# Patient Record
Sex: Female | Born: 1965 | Hispanic: No | Smoking: Current every day smoker
Health system: Southern US, Community
[De-identification: ages and names within clinical notes are randomized; demographics above are authoritative.]

## PROBLEM LIST (undated history)

## (undated) DIAGNOSIS — F4310 Post-traumatic stress disorder, unspecified: Secondary | ICD-10-CM

## (undated) DIAGNOSIS — J449 Chronic obstructive pulmonary disease, unspecified: Secondary | ICD-10-CM

## (undated) DIAGNOSIS — N289 Disorder of kidney and ureter, unspecified: Secondary | ICD-10-CM

## (undated) HISTORY — PX: OTHER SURGICAL HISTORY: SHX169

## (undated) HISTORY — DX: Disorder of kidney and ureter, unspecified: N28.9

## (undated) HISTORY — DX: Post-traumatic stress disorder, unspecified: F43.10

## (undated) HISTORY — PX: TUBAL LIGATION: SHX77

## (undated) HISTORY — PX: CHOLECYSTECTOMY: SHX55

## (undated) HISTORY — DX: Chronic obstructive pulmonary disease, unspecified: J44.9

## (undated) HISTORY — PX: MULTIPLE TOOTH EXTRACTIONS: SHX2053

---

## 1998-08-02 ENCOUNTER — Emergency Department (HOSPITAL_COMMUNITY): Admission: EM | Admit: 1998-08-02 | Discharge: 1998-08-02 | Payer: Self-pay | Admitting: Internal Medicine

## 1999-06-20 ENCOUNTER — Encounter: Admission: RE | Admit: 1999-06-20 | Discharge: 1999-06-20 | Payer: Self-pay | Admitting: Internal Medicine

## 1999-07-08 ENCOUNTER — Encounter: Admission: RE | Admit: 1999-07-08 | Discharge: 1999-07-08 | Payer: Self-pay | Admitting: Obstetrics & Gynecology

## 2002-02-22 ENCOUNTER — Other Ambulatory Visit: Admission: RE | Admit: 2002-02-22 | Discharge: 2002-02-22 | Payer: Self-pay | Admitting: Family Medicine

## 2005-11-24 ENCOUNTER — Emergency Department (HOSPITAL_COMMUNITY): Admission: EM | Admit: 2005-11-24 | Discharge: 2005-11-24 | Payer: Self-pay | Admitting: Emergency Medicine

## 2007-02-25 ENCOUNTER — Ambulatory Visit: Payer: Self-pay | Admitting: Nurse Practitioner

## 2007-02-28 ENCOUNTER — Ambulatory Visit (HOSPITAL_COMMUNITY): Admission: RE | Admit: 2007-02-28 | Discharge: 2007-02-28 | Payer: Self-pay | Admitting: Family Medicine

## 2007-03-10 ENCOUNTER — Ambulatory Visit: Payer: Self-pay | Admitting: Nurse Practitioner

## 2007-03-10 ENCOUNTER — Encounter (INDEPENDENT_AMBULATORY_CARE_PROVIDER_SITE_OTHER): Payer: Self-pay | Admitting: Nurse Practitioner

## 2007-04-07 ENCOUNTER — Ambulatory Visit: Payer: Self-pay | Admitting: Family Medicine

## 2008-01-11 IMAGING — US US TRANSVAGINAL NON-OB
1 series · 14 of 25 positions shown · non-contrast
Comparison: none

CLINICAL DATA: Patient has uterine bleeding.  History of left ovarian cyst. 
 TRANSABDOMINAL AND TRANSVAGINAL PELVIC ULTRASOUND:
TECHNIQUE: Both transabdominal and transvaginal ultrasound examinations of the pelvis were performed including evaluation of the uterus, ovaries, adnexal regions, and pelvic cul-de-sac.

[Series 1: unknown · 0.33mm/px · 14 of 109 slices shown]
[im 1/109]
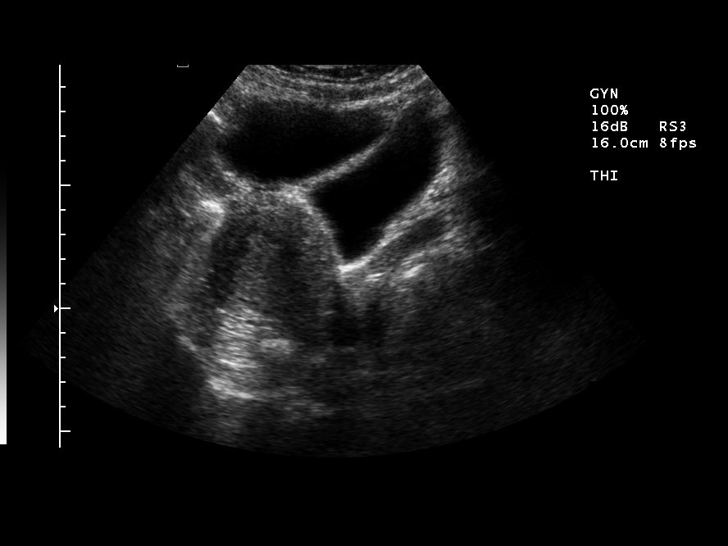
[im 10/109]
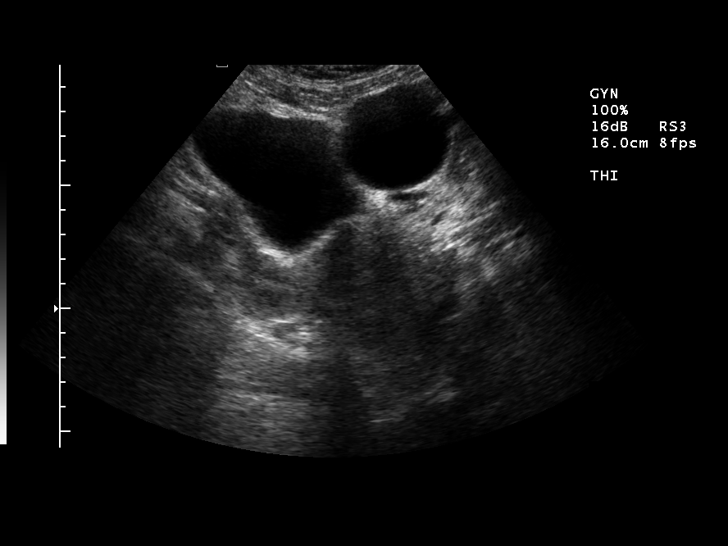
[im 19/109]
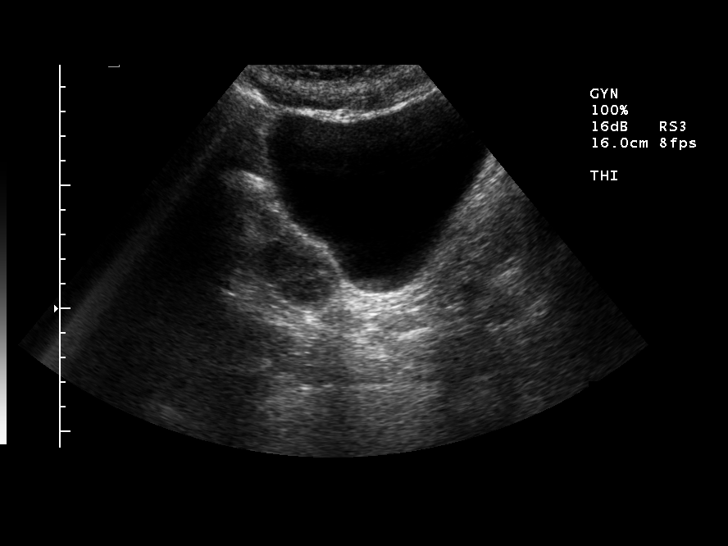
[im 28/109]
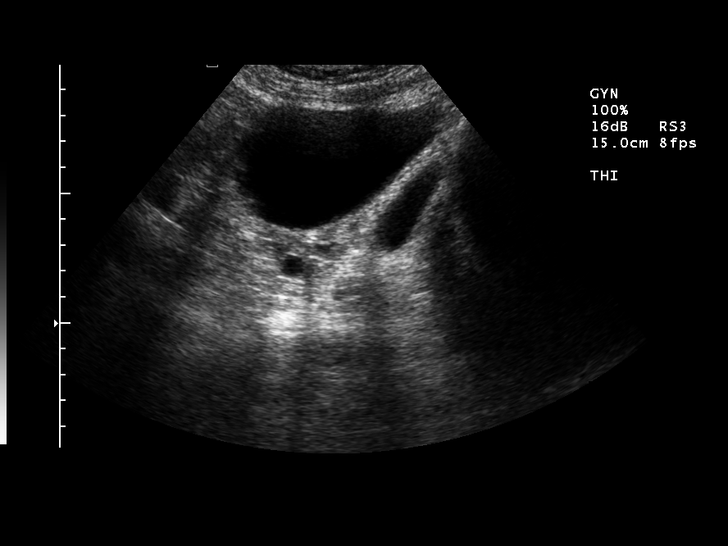
[im 37/109]
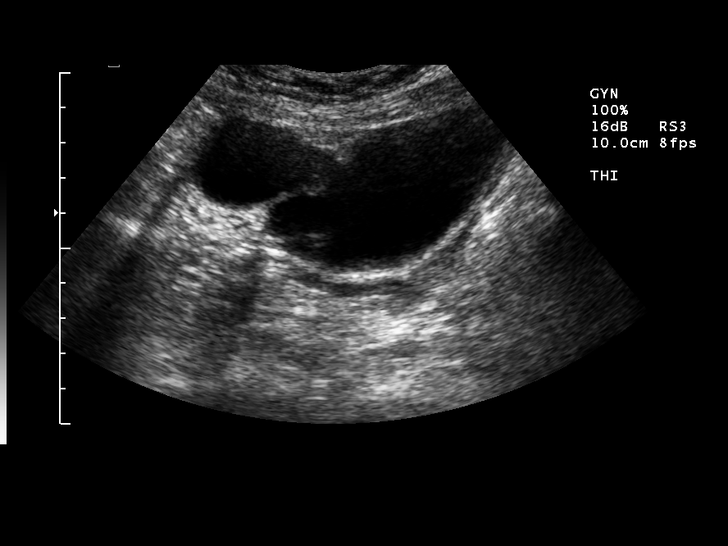
[im 41/109]
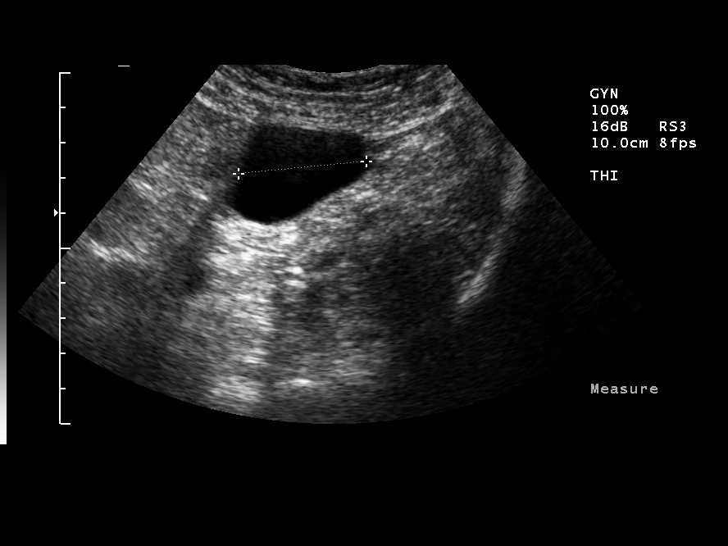
[im 50/109]
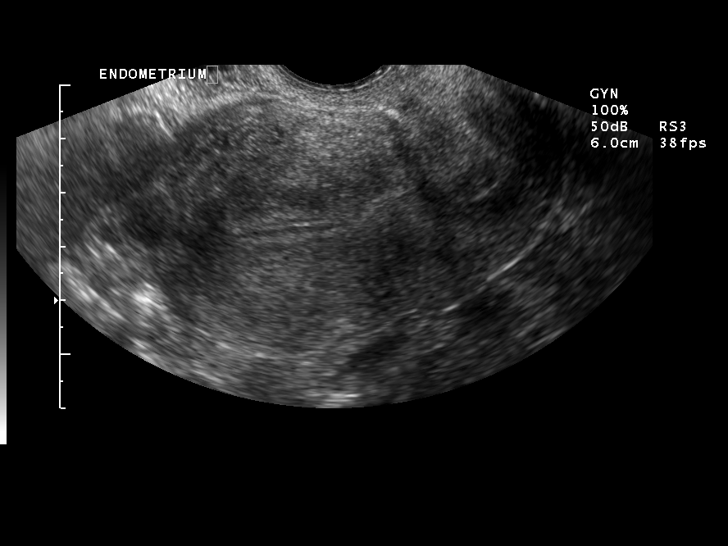
[im 59/109]
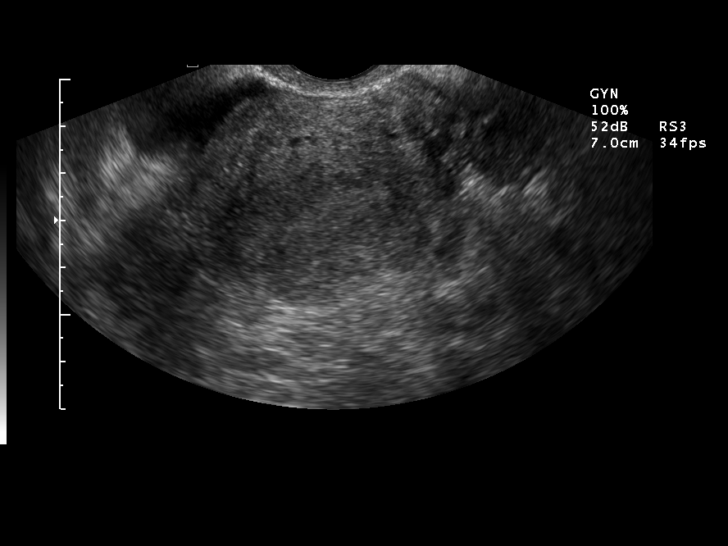
[im 68/109]
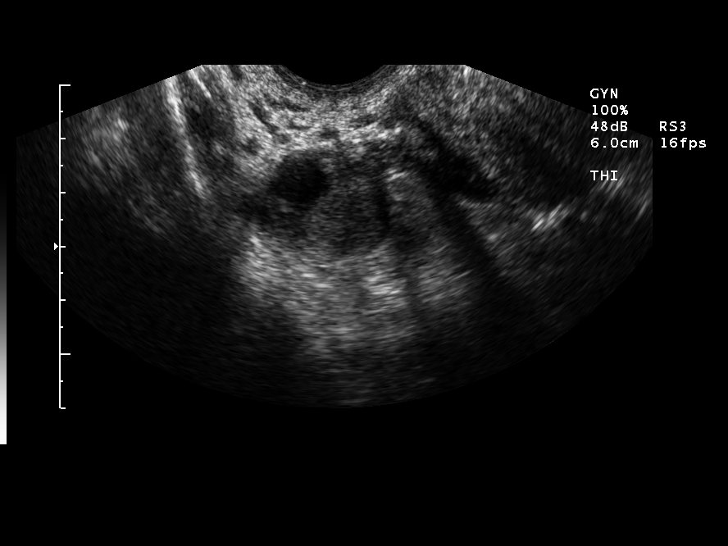
[im 73/109]
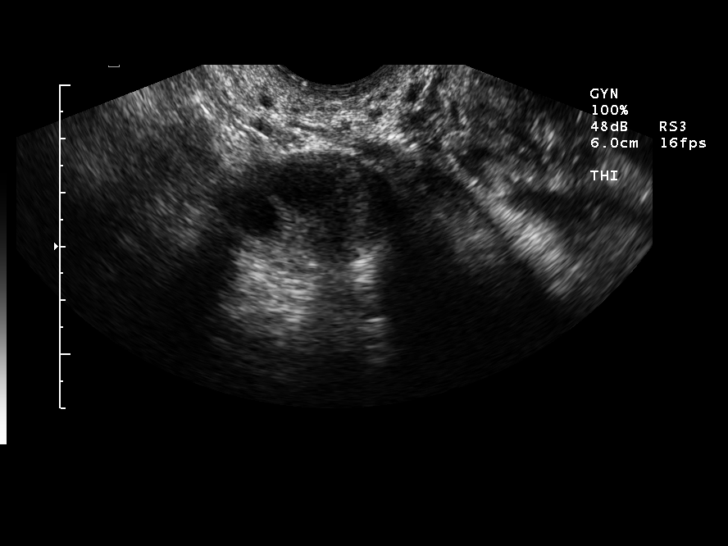
[im 82/109]
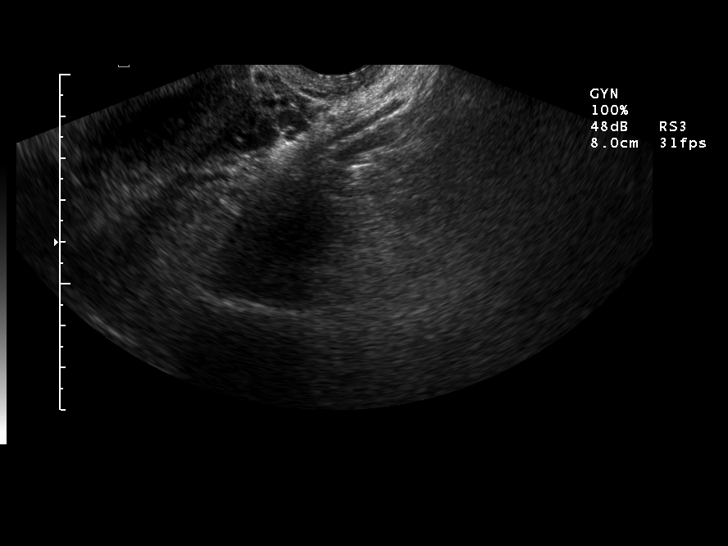
[im 91/109]
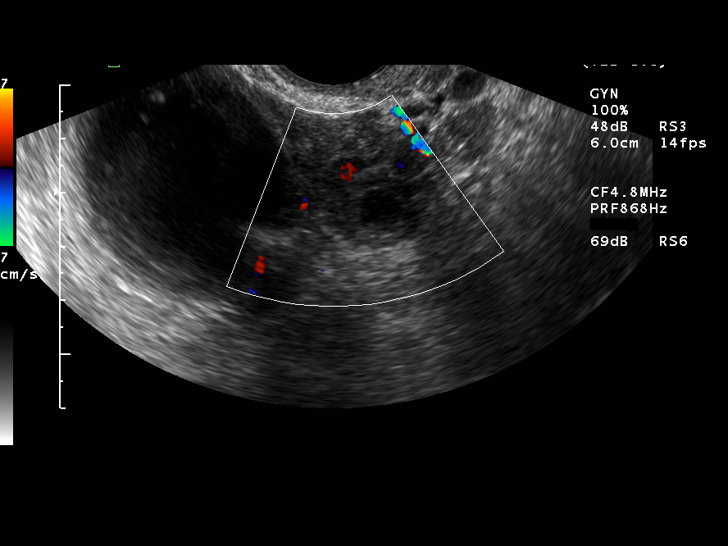
[im 100/109]
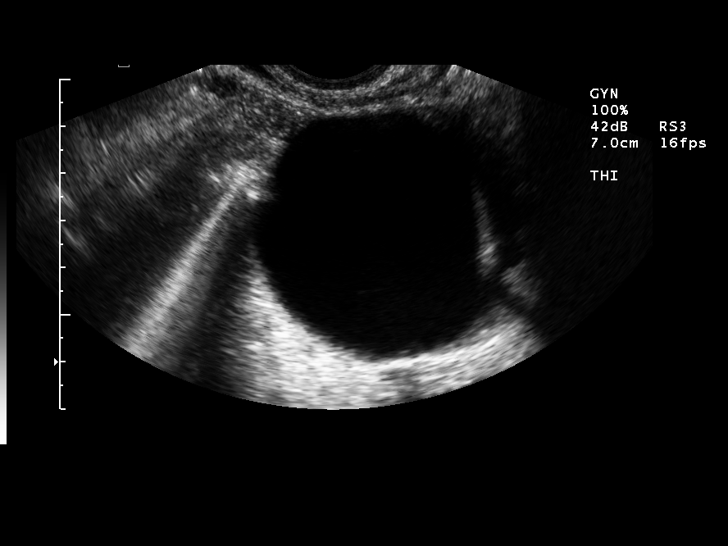
[im 109/109]
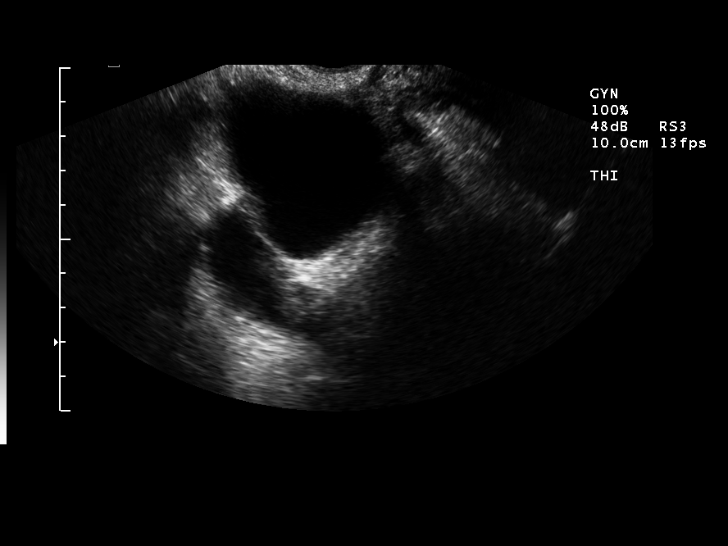

[14 of 25 positions shown; findings below may reference images not displayed]

FINDINGS: The uterus measures 8.3 x 4.6 x 5.5 cm.  Endometrial stripe is normal measuring up to 5.6 mm.  Right ovary measures 3.5 x 2.3 x 2.2 cm with a 1 cm right ovarian cyst.  The left ovary measures 2.7 x 1.9 x 1.6 cm with also a 1 cm cyst.  There is an 8.2 x 5.0 x 5.2 cm exophytic cystic area in the region of the left adnexa.  There is a small amount of free fluid.
IMPRESSION: 1.  8.2 cm left adnexal cyst.  
 2.  Followup ultrasound would be suggested in 2 to 3 months.

## 2010-11-16 ENCOUNTER — Encounter: Payer: Self-pay | Admitting: Nurse Practitioner

## 2021-04-25 DIAGNOSIS — Z419 Encounter for procedure for purposes other than remedying health state, unspecified: Secondary | ICD-10-CM | POA: Diagnosis not present

## 2021-05-26 DIAGNOSIS — Z419 Encounter for procedure for purposes other than remedying health state, unspecified: Secondary | ICD-10-CM | POA: Diagnosis not present

## 2021-06-26 DIAGNOSIS — Z419 Encounter for procedure for purposes other than remedying health state, unspecified: Secondary | ICD-10-CM | POA: Diagnosis not present

## 2021-07-17 ENCOUNTER — Encounter (HOSPITAL_COMMUNITY): Payer: Self-pay | Admitting: Emergency Medicine

## 2021-07-17 ENCOUNTER — Emergency Department (HOSPITAL_COMMUNITY)
Admission: EM | Admit: 2021-07-17 | Discharge: 2021-07-17 | Disposition: A | Discharge status: Home / Self Care | Payer: Medicaid Other | Attending: Emergency Medicine | Admitting: Emergency Medicine

## 2021-07-17 ENCOUNTER — Other Ambulatory Visit: Payer: Self-pay

## 2021-07-17 DIAGNOSIS — R000 Tachycardia, unspecified: Secondary | ICD-10-CM | POA: Insufficient documentation

## 2021-07-17 DIAGNOSIS — Z760 Encounter for issue of repeat prescription: Secondary | ICD-10-CM | POA: Insufficient documentation

## 2021-07-17 MED ORDER — CYCLOBENZAPRINE HCL 10 MG PO TABS: 10.0000 mg | ORAL_TABLET | Freq: Two times a day (BID) | ORAL | 0 refills | Status: DC | PRN

## 2021-07-17 NOTE — ED Provider Notes (Signed)
[facility] EMERGENCY DEPARTMENT Provider Note   CSN: [id number] Arrival date & time: 07/17/21  0630     History Chief Complaint  Patient presents with   Medication Refill    Tara Harper is a 55 y.o. female.  The history is provided by the patient. No language interpreter was used.  Medication Refill   55 year old female with hx of MS who presents requesting for medication refill.  She report she recently moved here, have not establish any primary care and have been without her medication for more than 1 month.  She is hoping to get her medication refill and to receive referral to PCP.  No SI/HI.  No new complaints  History reviewed. No pertinent past medical history.  There are no problems to display for this patient.   The histories are not reviewed yet. Please review them in the "History" navigator section and refresh this SmartLink.   OB History   No obstetric history on file.     No family history on file.     Home Medications Prior to Admission medications   Medication Sig Start Date End Date Taking? Authorizing Provider  cyclobenzaprine (FLEXERIL) 10 MG tablet Take 1 tablet (10 mg total) by mouth 2 (two) times daily as needed for muscle spasms. 07/17/21  Yes Fayrene Helper, PA-C    Allergies    Patient has no known allergies.  Review of Systems   Review of Systems  Constitutional:  Negative for fever.  Eyes:  Negative for visual disturbance.  Respiratory:  Negative for shortness of breath.   Cardiovascular:  Negative for chest pain.  Gastrointestinal:  Negative for abdominal pain.  Neurological:  Negative for headaches.  All other systems reviewed and are negative.  Physical Exam Updated Vital Signs BP (!) 144/90 (BP Location: Left Arm)   Pulse (!) 113   Temp 98.3 F (36.8 C) (Oral)   Resp 14   SpO2 99%   Physical Exam Vitals and nursing note reviewed.  Constitutional:      General: She is not in acute distress.     Appearance: She is well-developed.  HENT:     Head: Atraumatic.  Eyes:     Conjunctiva/sclera: Conjunctivae normal.  Cardiovascular:     Rate and Rhythm: Tachycardia present.  Pulmonary:     Effort: Pulmonary effort is normal.  Musculoskeletal:     Cervical back: Neck supple.  Skin:    Findings: No rash.  Neurological:     Mental Status: She is alert.  Psychiatric:        Mood and Affect: Mood normal.    ED Results / Procedures / Treatments   Labs (all labs ordered are listed, but only abnormal results are displayed) Labs Reviewed - No data to display  EKG None  Radiology No results found.  Procedures Procedures   Medications Ordered in ED Medications - No data to display  ED Course  I have reviewed the triage vital signs and the nursing notes.  Pertinent labs & imaging results that were available during my care of the patient were reviewed by me and considered in my medical decision making (see chart for details).    MDM Rules/Calculators/A&P                           BP (!) 144/90 (BP Location: Left Arm)   Pulse (!) 113   Temp 98.3 F (36.8 C) (Oral)   Resp  14   SpO2 99%   Final Clinical Impression(s) / ED Diagnoses Final diagnoses:  Medication refill    Rx / DC Orders ED Discharge Orders          Ordered    cyclobenzaprine (FLEXERIL) 10 MG tablet  2 times daily PRN        07/17/21 0931           Pt here requesting to have a PCP referral as well as refill of her MS medication that she has been out of for more than 1 month.  I have given her resources for PCP.  Will also prescribe flexeril per request.     Fayrene Helper, PA-C 07/17/21 1062    Linwood Dibbles, MD 07/20/21 [phone]

## 2021-07-17 NOTE — ED Notes (Signed)
Patient discharge paperwork reviewed with the patient. The patient verbalized understanding of instructions. Pt discharged.

## 2021-07-17 NOTE — Discharge Instructions (Signed)
Please call and contact [facility] for outpatient care.

## 2021-07-17 NOTE — ED Triage Notes (Signed)
Patient coming from home, complaint of being out of all medications for approx 1 month. Pt states she moved back from [place] and has not been able to setup PCP and has not been able to get medications refilled.

## 2021-07-23 ENCOUNTER — Ambulatory Visit: Payer: Self-pay | Admitting: Nurse Practitioner

## 2021-07-26 DIAGNOSIS — Z419 Encounter for procedure for purposes other than remedying health state, unspecified: Secondary | ICD-10-CM | POA: Diagnosis not present

## 2021-08-26 DIAGNOSIS — Z419 Encounter for procedure for purposes other than remedying health state, unspecified: Secondary | ICD-10-CM | POA: Diagnosis not present

## 2021-09-01 ENCOUNTER — Ambulatory Visit: Payer: Medicaid Other | Admitting: Family Medicine

## 2021-09-25 DIAGNOSIS — Z419 Encounter for procedure for purposes other than remedying health state, unspecified: Secondary | ICD-10-CM | POA: Diagnosis not present

## 2021-10-26 DIAGNOSIS — Z419 Encounter for procedure for purposes other than remedying health state, unspecified: Secondary | ICD-10-CM | POA: Diagnosis not present

## 2021-11-26 DIAGNOSIS — Z419 Encounter for procedure for purposes other than remedying health state, unspecified: Secondary | ICD-10-CM | POA: Diagnosis not present

## 2021-12-09 ENCOUNTER — Ambulatory Visit: Payer: Medicaid Other | Admitting: Adult Health

## 2021-12-18 ENCOUNTER — Other Ambulatory Visit: Payer: Self-pay

## 2021-12-18 ENCOUNTER — Ambulatory Visit (INDEPENDENT_AMBULATORY_CARE_PROVIDER_SITE_OTHER): Payer: Medicaid Other | Admitting: Family

## 2021-12-18 ENCOUNTER — Encounter: Payer: Self-pay | Admitting: Family

## 2021-12-18 VITALS — BP 124/86 | HR 88 | Ht 64.0 in | Wt 148.0 lb

## 2021-12-18 DIAGNOSIS — E559 Vitamin D deficiency, unspecified: Secondary | ICD-10-CM

## 2021-12-18 DIAGNOSIS — G35 Multiple sclerosis: Secondary | ICD-10-CM

## 2021-12-18 DIAGNOSIS — R10821 Right upper quadrant rebound abdominal tenderness: Secondary | ICD-10-CM

## 2021-12-18 DIAGNOSIS — F4312 Post-traumatic stress disorder, chronic: Secondary | ICD-10-CM | POA: Diagnosis not present

## 2021-12-18 DIAGNOSIS — K219 Gastro-esophageal reflux disease without esophagitis: Secondary | ICD-10-CM

## 2021-12-18 DIAGNOSIS — K069 Disorder of gingiva and edentulous alveolar ridge, unspecified: Secondary | ICD-10-CM | POA: Diagnosis not present

## 2021-12-18 DIAGNOSIS — J449 Chronic obstructive pulmonary disease, unspecified: Secondary | ICD-10-CM | POA: Diagnosis not present

## 2021-12-18 DIAGNOSIS — R17 Unspecified jaundice: Secondary | ICD-10-CM

## 2021-12-18 DIAGNOSIS — F331 Major depressive disorder, recurrent, moderate: Secondary | ICD-10-CM | POA: Diagnosis not present

## 2021-12-18 DIAGNOSIS — E782 Mixed hyperlipidemia: Secondary | ICD-10-CM

## 2021-12-18 DIAGNOSIS — F17200 Nicotine dependence, unspecified, uncomplicated: Secondary | ICD-10-CM

## 2021-12-18 DIAGNOSIS — Z9189 Other specified personal risk factors, not elsewhere classified: Secondary | ICD-10-CM

## 2021-12-18 DIAGNOSIS — R634 Abnormal weight loss: Secondary | ICD-10-CM

## 2021-12-18 DIAGNOSIS — E538 Deficiency of other specified B group vitamins: Secondary | ICD-10-CM

## 2021-12-18 DIAGNOSIS — R160 Hepatomegaly, not elsewhere classified: Secondary | ICD-10-CM

## 2021-12-18 DIAGNOSIS — M62838 Other muscle spasm: Secondary | ICD-10-CM | POA: Diagnosis not present

## 2021-12-18 DIAGNOSIS — H259 Unspecified age-related cataract: Secondary | ICD-10-CM | POA: Diagnosis not present

## 2021-12-18 DIAGNOSIS — F419 Anxiety disorder, unspecified: Secondary | ICD-10-CM

## 2021-12-18 DIAGNOSIS — R809 Proteinuria, unspecified: Secondary | ICD-10-CM

## 2021-12-18 DIAGNOSIS — N1832 Chronic kidney disease, stage 3b: Secondary | ICD-10-CM

## 2021-12-18 DIAGNOSIS — G43119 Migraine with aura, intractable, without status migrainosus: Secondary | ICD-10-CM

## 2021-12-18 MED ORDER — SIMVASTATIN 40 MG PO TABS: 40.0000 mg | ORAL_TABLET | Freq: Every day | ORAL | 3 refills | Status: DC

## 2021-12-18 MED ORDER — TIZANIDINE HCL 4 MG PO TABS: 4.0000 mg | ORAL_TABLET | Freq: Four times a day (QID) | ORAL | 2 refills | Status: DC | PRN

## 2021-12-18 MED ORDER — OMEPRAZOLE 20 MG PO CPDR: 20.0000 mg | DELAYED_RELEASE_CAPSULE | Freq: Every day | ORAL | 1 refills | Status: DC

## 2021-12-18 MED ORDER — ESCITALOPRAM OXALATE 10 MG PO TABS: 10.0000 mg | ORAL_TABLET | Freq: Every day | ORAL | 0 refills | Status: DC
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
## 2021-12-18 NOTE — Patient Instructions (Addendum)
A referral was placed today for neurology for your MS. Also referred to dental for dental health evaluation.  Please let us know if you have not heard back within 1 week about your referral.  I have ordered imaging for you at [facility] outpatient diagnostic center for ultrasound abdomen to include the liver. This order has been sent over for you electronically.  Please call [phone] to schedule this appointment.  I have created an order for lab work today during our visit.  Please schedule an appointment on your way out to return to the lab at your convenience. Please return fasting at your lab appointment (meaning you can only drink black coffee and or water prior to your appointment). I will reach out to you in regards to the labs when I receive the results.   Start lexapro 10 mg for anxiety and depression. Take 1/2 tablet by mouth once daily for about one week, then increase to 1 full tablet thereafter.   Taking the medicine as directed and not missing any doses is one of the best things you can do to treat your anxiety/depression.  Here are some things to keep in mind:  Side effects (stomach upset, some increased anxiety) may happen before you notice a benefit.  These side effects typically go away over time. Changes to your dose of medicine or a change in medication all together is sometimes necessary Many people will notice an improvement within two weeks but the full effect of the medication can take up to 4-6 weeks Stopping the medication when you start feeling better often results in a return of symptoms. Most people need to be on medication at least 6-12 months If you start having thoughts of hurting yourself or others after starting this medicine, please call me immediately.    It was a pleasure seeing you today! Please do not hesitate to reach out with any questions and or concerns.  Regards,   Mort Sawyers FNP-C

## 2021-12-19 ENCOUNTER — Other Ambulatory Visit (INDEPENDENT_AMBULATORY_CARE_PROVIDER_SITE_OTHER): Payer: Medicaid Other

## 2021-12-19 ENCOUNTER — Telehealth: Payer: Self-pay | Admitting: Family

## 2021-12-19 ENCOUNTER — Other Ambulatory Visit: Payer: Self-pay | Admitting: Family

## 2021-12-19 DIAGNOSIS — G35D Multiple sclerosis, unspecified: Secondary | ICD-10-CM

## 2021-12-19 DIAGNOSIS — R160 Hepatomegaly, not elsewhere classified: Secondary | ICD-10-CM

## 2021-12-19 DIAGNOSIS — R17 Unspecified jaundice: Secondary | ICD-10-CM

## 2021-12-19 DIAGNOSIS — G35 Multiple sclerosis: Secondary | ICD-10-CM | POA: Diagnosis not present

## 2021-12-19 DIAGNOSIS — R634 Abnormal weight loss: Secondary | ICD-10-CM

## 2021-12-19 DIAGNOSIS — N1832 Chronic kidney disease, stage 3b: Secondary | ICD-10-CM | POA: Diagnosis not present

## 2021-12-19 DIAGNOSIS — E782 Mixed hyperlipidemia: Secondary | ICD-10-CM

## 2021-12-19 DIAGNOSIS — E538 Deficiency of other specified B group vitamins: Secondary | ICD-10-CM

## 2021-12-19 DIAGNOSIS — R10821 Right upper quadrant rebound abdominal tenderness: Secondary | ICD-10-CM

## 2021-12-19 DIAGNOSIS — E559 Vitamin D deficiency, unspecified: Secondary | ICD-10-CM | POA: Diagnosis not present

## 2021-12-19 DIAGNOSIS — R1011 Right upper quadrant pain: Secondary | ICD-10-CM

## 2021-12-19 LAB — CBC WITH DIFFERENTIAL/PLATELET
Basophils Absolute: 0.1 10*3/uL (ref 0.0–0.1)
Basophils Relative: 0.7 % (ref 0.0–3.0)
Eosinophils Absolute: 0.1 10*3/uL (ref 0.0–0.7)
Eosinophils Relative: 1.8 % (ref 0.0–5.0)
HCT: 44.1 % (ref 36.0–46.0)
Hemoglobin: 14.3 g/dL (ref 12.0–15.0)
Lymphocytes Relative: 43.4 % (ref 12.0–46.0)
Lymphs Abs: 3.1 10*3/uL (ref 0.7–4.0)
MCHC: 32.4 g/dL (ref 30.0–36.0)
MCV: 92.7 fl (ref 78.0–100.0)
Monocytes Absolute: 0.4 10*3/uL (ref 0.1–1.0)
Monocytes Relative: 5.2 % (ref 3.0–12.0)
Neutro Abs: 3.5 10*3/uL (ref 1.4–7.7)
Neutrophils Relative %: 48.9 % (ref 43.0–77.0)
Platelets: 243 10*3/uL (ref 150.0–400.0)
RBC: 4.75 Mil/uL (ref 3.87–5.11)
RDW: 13.4 % (ref 11.5–15.5)
WBC: 7.1 10*3/uL (ref 4.0–10.5)

## 2021-12-19 LAB — LIPID PANEL
Cholesterol: 180 mg/dL (ref 0–200)
HDL: 38 mg/dL — ABNORMAL LOW (ref >39.00)
LDL Cholesterol: 108 mg/dL — ABNORMAL HIGH (ref 0–99)
NonHDL: 142.09
Total CHOL/HDL Ratio: 5
Triglycerides: 169 mg/dL — ABNORMAL HIGH (ref 0.0–149.0)
VLDL: 33.8 mg/dL (ref 0.0–40.0)

## 2021-12-19 LAB — B12 AND FOLATE PANEL
Folate: 5.8 ng/mL — ABNORMAL LOW (ref >5.9)
Vitamin B-12: 785 pg/mL (ref 211–911)

## 2021-12-19 LAB — COMPREHENSIVE METABOLIC PANEL
ALT: 8 U/L (ref 0–35)
AST: 13 U/L (ref 0–37)
Albumin: 3.9 g/dL (ref 3.5–5.2)
Alkaline Phosphatase: 80 U/L (ref 39–117)
BUN: 17 mg/dL (ref 6–23)
CO2: 28 mEq/L (ref 19–32)
Calcium: 9.2 mg/dL (ref 8.4–10.5)
Chloride: 108 mEq/L (ref 96–112)
Creatinine, Ser: 1.83 mg/dL — ABNORMAL HIGH (ref 0.40–1.20)
GFR: 30.63 mL/min — ABNORMAL LOW (ref >60.00)
Glucose, Bld: 93 mg/dL (ref 70–99)
Potassium: 4.7 mEq/L (ref 3.5–5.1)
Sodium: 140 mEq/L (ref 135–145)
Total Bilirubin: 0.4 mg/dL (ref 0.2–1.2)
Total Protein: 6.4 g/dL (ref 6.0–8.3)

## 2021-12-19 LAB — VITAMIN D 25 HYDROXY (VIT D DEFICIENCY, FRACTURES): VITD: 40.21 ng/mL (ref 30.00–100.00)

## 2021-12-19 LAB — T4, FREE: Free T4: 0.81 ng/dL (ref 0.60–1.60)

## 2021-12-19 LAB — TSH: TSH: 2.32 u[IU]/mL (ref 0.35–5.50)

## 2021-12-19 LAB — T3, FREE: T3, Free: 3.1 pg/mL (ref 2.3–4.2)

## 2021-12-19 NOTE — Telephone Encounter (Signed)
Pt called--questioning if needed to be worried about getting the ultrasound done, what being said after OV yesterday. Pt questioning if ok to go out of town tomorrow.

## 2021-12-19 NOTE — Telephone Encounter (Signed)
Pt would like tabitha dugal to give her a call today about concerns of her appointment with tabitha from 2.23.23

## 2021-12-19 NOTE — Telephone Encounter (Signed)
Pt stated --drunk coffee with cream but no food and ashtyn will call her back  for info.

## 2021-12-22 ENCOUNTER — Other Ambulatory Visit: Payer: Self-pay

## 2021-12-22 ENCOUNTER — Ambulatory Visit: Payer: Medicaid Other

## 2021-12-22 ENCOUNTER — Ambulatory Visit
Admission: RE | Admit: 2021-12-22 | Discharge: 2021-12-22 | Disposition: A | Discharge status: Home / Self Care | Payer: Medicaid Other | Source: Ambulatory Visit | Attending: Family | Admitting: Family

## 2021-12-22 DIAGNOSIS — R160 Hepatomegaly, not elsewhere classified: Secondary | ICD-10-CM | POA: Insufficient documentation

## 2021-12-22 DIAGNOSIS — R634 Abnormal weight loss: Secondary | ICD-10-CM | POA: Insufficient documentation

## 2021-12-22 DIAGNOSIS — E559 Vitamin D deficiency, unspecified: Secondary | ICD-10-CM | POA: Insufficient documentation

## 2021-12-22 DIAGNOSIS — R10821 Right upper quadrant rebound abdominal tenderness: Secondary | ICD-10-CM | POA: Insufficient documentation

## 2021-12-22 DIAGNOSIS — R1011 Right upper quadrant pain: Secondary | ICD-10-CM | POA: Insufficient documentation

## 2021-12-22 DIAGNOSIS — K7689 Other specified diseases of liver: Secondary | ICD-10-CM | POA: Diagnosis not present

## 2021-12-22 DIAGNOSIS — E538 Deficiency of other specified B group vitamins: Secondary | ICD-10-CM | POA: Insufficient documentation

## 2021-12-22 DIAGNOSIS — N281 Cyst of kidney, acquired: Secondary | ICD-10-CM | POA: Diagnosis not present

## 2021-12-22 DIAGNOSIS — R17 Unspecified jaundice: Secondary | ICD-10-CM | POA: Diagnosis not present

## 2021-12-22 DIAGNOSIS — R809 Proteinuria, unspecified: Secondary | ICD-10-CM | POA: Insufficient documentation

## 2021-12-22 LAB — HEPATITIS PANEL, ACUTE
Hep A IgM: NONREACTIVE
Hep B C IgM: NONREACTIVE
Hepatitis B Surface Ag: NONREACTIVE
Hepatitis C Ab: NONREACTIVE
SIGNAL TO CUT-OFF: <0.02 (ref <1.00)

## 2021-12-22 LAB — HIV ANTIBODY (ROUTINE TESTING W REFLEX): HIV 1&2 Ab, 4th Generation: NONREACTIVE

## 2021-12-22 NOTE — Assessment & Plan Note (Signed)
B12 ordered pending results °

## 2021-12-22 NOTE — Assessment & Plan Note (Signed)
Chronic was following nephrology in [place].  Referral today for consult for management.  Also assessing kidney function today

## 2021-12-22 NOTE — Assessment & Plan Note (Signed)
Hepatomegaly palpated on exam patient with yellowing skin ordering ultrasound stat pending results.  Also doing work-up for liver function

## 2021-12-22 NOTE — Assessment & Plan Note (Signed)
Continue omeprazole refill sent to the pharmacy. Try to decrease and or avoid spicy foods, fried fatty foods, and also caffeine and chocolate as these can increase heartburn symptoms.

## 2021-12-22 NOTE — Assessment & Plan Note (Signed)
Seemingly stable at current we will continue to monitor

## 2021-12-22 NOTE — Assessment & Plan Note (Addendum)
Patient with no desire to quit spoke with smoking cessation patient refuses We will discuss with patient more detail next visit and consider referral to lung screening clinic

## 2021-12-22 NOTE — Assessment & Plan Note (Signed)
Recommendations for both psychiatry as well as psychology for ongoing management.

## 2021-12-22 NOTE — Progress Notes (Signed)
Borderline cholesterol , work on low-cholesterol diet  Unable to compare kidney function to anything prior as patient new to our practice however she does states she was seeing a kidney specialist in the past for chronic kidney disease almost end-stage.  Referral for nephrologist in place please have her follow-up with them as her kidney function is on the lower end  B12 okay folate is low I recommend a daily over-the-counter supplement folic acid

## 2021-12-22 NOTE — Assessment & Plan Note (Signed)
Palpable liver on right upper quadrant exam today also with yellowing appearance of skin.  Abdominal ultrasound ordered pending results as well as work-up for lab work

## 2021-12-22 NOTE — Assessment & Plan Note (Addendum)
Pending  results of vitamin D ordered today

## 2021-12-22 NOTE — Progress Notes (Signed)
Liver not found to be concerning on ultrasound which is great news.  Pancreas that was visualized is good gallbladder is not in place due to surgery in the past.  There were noted to be right kidney cyst and increased echogenicity of the right kidney consistent with chronic kidney disease.  Follow-up with nephrologist as scheduled/as referred

## 2021-12-22 NOTE — Assessment & Plan Note (Signed)
Patient with frequent muscle spasms/muscle spasticity due to multiple sclerosis tizanidine usually is helpful.  Refilled with tizanidine patient also to follow-up with neurologist

## 2021-12-22 NOTE — Assessment & Plan Note (Signed)
Patient to continue with dentist as scheduled

## 2021-12-22 NOTE — Assessment & Plan Note (Signed)
Some lab work pending for a work-up of abnormal weight loss we will also check thyroid levels

## 2021-12-22 NOTE — Assessment & Plan Note (Signed)
Lipid panel ordered pending results patient work on low-cholesterol diet and exercise as tolerated, refill sent for simvastatin

## 2021-12-22 NOTE — Assessment & Plan Note (Addendum)
Work-up for liver evaluation to include ultrasound as well as lab work Extensive work-up to include several imaging as well as physical exam concerns as well as acute concerns and refilling medication speaking with patient to exceed 75 minutes.

## 2021-12-22 NOTE — Assessment & Plan Note (Signed)
Stable per current used to be on Elavil in the past with her neurologist.  Has been without medication for several months now.  Frequency is still stable however.  Patient to continue with ibuprofen as needed follow-up in 4 if frequency is to increase

## 2021-12-22 NOTE — Assessment & Plan Note (Signed)
Restart Lexapro  I instructed pt to start 1/2 tablet once daily for 1 week and then increase to a full tablet once daily on week two as tolerated.  We discussed common side effects such as nausea, drowsiness and weight gain.  Also discussed rare but serious side effect of suicidal ideation.  She is instructed to discontinue medication and go directly to ED if this occurs.  Pt verbalizes understanding.  Plan is to follow up in 30 days to evaluate progress.    Advised patient for now we will send to neurology to determine if they want to add this on Elavil can increase the toxicity or absorption of Lexapro.

## 2021-12-22 NOTE — Assessment & Plan Note (Signed)
Patient under care of dentist just recently saw today continue follow-up as necessary with dental

## 2021-12-22 NOTE — Assessment & Plan Note (Signed)
Patient to establish with ophthalmologist as she may need surgical resection of these cataracts

## 2021-12-22 NOTE — Assessment & Plan Note (Signed)
CMP ordered pending results also referral to nephrologist as patient was seeing in [place] and needs new nephrologist.

## 2021-12-22 NOTE — Assessment & Plan Note (Signed)
Patient no longer on medication and states lapsed since her last refill and has not recently seen a urologist.  Now patient with insurance and able to see neurologist again.  Ambulatory referral placed for evaluation and treat as well as consult

## 2021-12-24 DIAGNOSIS — Z419 Encounter for procedure for purposes other than remedying health state, unspecified: Secondary | ICD-10-CM | POA: Diagnosis not present

## 2022-01-02 ENCOUNTER — Encounter: Payer: Self-pay | Admitting: *Deleted

## 2022-01-14 ENCOUNTER — Other Ambulatory Visit: Payer: Self-pay | Admitting: Family

## 2022-01-14 DIAGNOSIS — F4312 Post-traumatic stress disorder, chronic: Secondary | ICD-10-CM

## 2022-01-15 ENCOUNTER — Other Ambulatory Visit: Payer: Self-pay

## 2022-01-15 ENCOUNTER — Telehealth: Payer: Self-pay

## 2022-01-15 ENCOUNTER — Encounter: Payer: Self-pay | Admitting: Family

## 2022-01-15 ENCOUNTER — Ambulatory Visit (INDEPENDENT_AMBULATORY_CARE_PROVIDER_SITE_OTHER): Payer: Medicaid Other | Admitting: Family

## 2022-01-15 VITALS — BP 146/90 | HR 94 | Ht 64.5 in | Wt 147.0 lb

## 2022-01-15 DIAGNOSIS — F4312 Post-traumatic stress disorder, chronic: Secondary | ICD-10-CM | POA: Diagnosis not present

## 2022-01-15 DIAGNOSIS — F331 Major depressive disorder, recurrent, moderate: Secondary | ICD-10-CM | POA: Diagnosis not present

## 2022-01-15 DIAGNOSIS — G35 Multiple sclerosis: Secondary | ICD-10-CM

## 2022-01-15 DIAGNOSIS — R7989 Other specified abnormal findings of blood chemistry: Secondary | ICD-10-CM

## 2022-01-15 DIAGNOSIS — D529 Folate deficiency anemia, unspecified: Secondary | ICD-10-CM

## 2022-01-15 DIAGNOSIS — I10 Essential (primary) hypertension: Secondary | ICD-10-CM

## 2022-01-15 DIAGNOSIS — R748 Abnormal levels of other serum enzymes: Secondary | ICD-10-CM

## 2022-01-15 DIAGNOSIS — R252 Cramp and spasm: Secondary | ICD-10-CM

## 2022-01-15 DIAGNOSIS — F419 Anxiety disorder, unspecified: Secondary | ICD-10-CM | POA: Diagnosis not present

## 2022-01-15 DIAGNOSIS — K219 Gastro-esophageal reflux disease without esophagitis: Secondary | ICD-10-CM | POA: Diagnosis not present

## 2022-01-15 LAB — MICROALBUMIN / CREATININE URINE RATIO
Creatinine,U: 116.3 mg/dL
Microalb Creat Ratio: 219.9 mg/g — ABNORMAL HIGH (ref 0.0–30.0)
Microalb, Ur: 255.7 mg/dL — ABNORMAL HIGH (ref 0.0–1.9)

## 2022-01-15 MED ORDER — OMEPRAZOLE 40 MG PO CPDR
40.0000 mg | DELAYED_RELEASE_CAPSULE | Freq: Every day | ORAL | 3 refills | Status: DC
Start: 2022-01-15 — End: 2022-07-08

## 2022-01-15 MED ORDER — LOSARTAN POTASSIUM 50 MG PO TABS: 50.0000 mg | ORAL_TABLET | Freq: Every day | ORAL | 1 refills | Status: DC

## 2022-01-15 MED ORDER — FOLIC ACID 1 MG PO TABS: 1.0000 mg | ORAL_TABLET | Freq: Every day | ORAL | 1 refills | Status: DC

## 2022-01-15 MED ORDER — ESCITALOPRAM OXALATE 20 MG PO TABS: 20.0000 mg | ORAL_TABLET | Freq: Every day | ORAL | 0 refills | Status: DC

## 2022-01-15 MED ORDER — TIZANIDINE HCL 4 MG PO TABS: ORAL_TABLET | ORAL | 2 refills | Status: DC

## 2022-01-15 MED ORDER — CYCLOBENZAPRINE HCL 5 MG PO TABS: ORAL_TABLET | ORAL | 2 refills | Status: DC

## 2022-01-15 NOTE — Progress Notes (Signed)
Urine microalbumin is  VERY high. Pt to call and make appt with kidney specialist, this along with decreased kidney function pt to be followed. Continue losartan as this protects the kidneys.
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
## 2022-01-15 NOTE — Patient Instructions (Addendum)
Your referral for your kidney doctor is with :  ?Maxie Barb, MD ?[phone] , you can call the number to schedule an appointment.  ? ?A referral was placed today for GI for bad reflux, possible need for Endoscopy and colonoscopy.  ?Please let us know if you have not heard back within 1 week about your referral. ? ?I have sent folic acid to your pharmacy to take daily as your folate is low.  ? ?I increased your omeprazole to 40 mg to see if this helps with symptoms.  ? ?I have also increased your lexapro to 20 mg that you will take once daily.  ? ?I want you to start taking daily losartan 50 mg once daily for your blood pressure.  ?Start monitoring your blood pressure daily, around the same time of day, for the next 2-3 weeks.  Ensure that you have rested for 30 minutes prior to checking your blood pressure. Record your readings and bring them to your next visit. ? ?It was a pleasure seeing you today! Please do not hesitate to reach out with any questions and or concerns. ? ?Regards,  ? ?Haroon Shatto ?FNP-C ? ? ? ? ? ? ?

## 2022-01-15 NOTE — Telephone Encounter (Signed)
CALLED PATIENT NO ANSWER LEFT VOICEMAIL FOR A CALL BACK ? ?

## 2022-01-17 LAB — URINE CULTURE
MICRO NUMBER:: 13170796
Result:: NO GROWTH
SPECIMEN QUALITY:: ADEQUATE

## 2022-01-18 DIAGNOSIS — R748 Abnormal levels of other serum enzymes: Secondary | ICD-10-CM | POA: Insufficient documentation

## 2022-01-18 DIAGNOSIS — R252 Cramp and spasm: Secondary | ICD-10-CM | POA: Insufficient documentation

## 2022-01-18 DIAGNOSIS — R7989 Other specified abnormal findings of blood chemistry: Secondary | ICD-10-CM | POA: Insufficient documentation

## 2022-01-18 DIAGNOSIS — I10 Essential (primary) hypertension: Secondary | ICD-10-CM | POA: Insufficient documentation

## 2022-01-18 DIAGNOSIS — D529 Folate deficiency anemia, unspecified: Secondary | ICD-10-CM | POA: Insufficient documentation

## 2022-01-18 NOTE — Assessment & Plan Note (Signed)
Refilled omeprazole 40 mg once daily  ?Try to decrease and or avoid spicy foods, fried fatty foods, and also caffeine and chocolate as these can increase heartburn symptoms.  ? ?

## 2022-01-18 NOTE — Assessment & Plan Note (Addendum)
Refilled losartan 50 mg  ?Ordered urine microalbumin ?Pt advised of the following:  ?Continue medication as prescribed. Monitor blood pressure periodically and/or when you feel symptomatic. Goal is <130/90 on average. Ensure that you have rested for 30 minutes prior to checking your blood pressure. Record your readings and bring them to your next visit if necessary.work on a low sodium diet. ? ?

## 2022-01-18 NOTE — Assessment & Plan Note (Signed)
Increased to lexapro 20 mg ?

## 2022-01-18 NOTE — Assessment & Plan Note (Signed)
rx sent for folic acid 1 mg to take daily ? ?

## 2022-01-18 NOTE — Assessment & Plan Note (Signed)
Continue, and refill given for tizanidine 4 mg , only take in QAM ?And new RX for flexeril 5 mg tablet to take QHS ?

## 2022-01-18 NOTE — Assessment & Plan Note (Signed)
Increase and RX sent for lexapro to 20 mg  ?Anxiety reducing handout given to pt.  ?Recommend pt establish with therapist ?

## 2022-01-18 NOTE — Assessment & Plan Note (Addendum)
Pt to call and make apt with neurology ?For muscle spasticity long d/w pt to take appropriately and will carefully monitor kidney function ?Continue, and refill given for tizanidine 4 mg , only take in QAM ?And new RX for flexeril 5 mg tablet to take QHS ? ?

## 2022-01-18 NOTE — Assessment & Plan Note (Signed)
Ordering urine culture to r/o uti  ? ?

## 2022-01-19 ENCOUNTER — Ambulatory Visit: Payer: Medicaid Other | Admitting: Gastroenterology

## 2022-01-24 DIAGNOSIS — Z419 Encounter for procedure for purposes other than remedying health state, unspecified: Secondary | ICD-10-CM | POA: Diagnosis not present

## 2022-01-26 ENCOUNTER — Other Ambulatory Visit: Payer: Self-pay | Admitting: Family

## 2022-01-26 DIAGNOSIS — F4312 Post-traumatic stress disorder, chronic: Secondary | ICD-10-CM

## 2022-01-26 DIAGNOSIS — F331 Major depressive disorder, recurrent, moderate: Secondary | ICD-10-CM

## 2022-01-27 DIAGNOSIS — Z905 Acquired absence of kidney: Secondary | ICD-10-CM | POA: Diagnosis not present

## 2022-01-27 DIAGNOSIS — I129 Hypertensive chronic kidney disease with stage 1 through stage 4 chronic kidney disease, or unspecified chronic kidney disease: Secondary | ICD-10-CM | POA: Diagnosis not present

## 2022-01-27 DIAGNOSIS — N1832 Chronic kidney disease, stage 3b: Secondary | ICD-10-CM | POA: Diagnosis not present

## 2022-01-27 DIAGNOSIS — R809 Proteinuria, unspecified: Secondary | ICD-10-CM | POA: Diagnosis not present

## 2022-01-28 ENCOUNTER — Encounter: Payer: Self-pay | Admitting: Neurology

## 2022-02-02 ENCOUNTER — Telehealth: Payer: Self-pay

## 2022-02-02 NOTE — Telephone Encounter (Signed)
Called pt and informed of the information.  ?

## 2022-02-18 DIAGNOSIS — N1832 Chronic kidney disease, stage 3b: Secondary | ICD-10-CM | POA: Diagnosis not present

## 2022-02-23 DIAGNOSIS — Z419 Encounter for procedure for purposes other than remedying health state, unspecified: Secondary | ICD-10-CM | POA: Diagnosis not present

## 2022-02-25 ENCOUNTER — Other Ambulatory Visit: Payer: Self-pay | Admitting: Family

## 2022-02-25 DIAGNOSIS — F4312 Post-traumatic stress disorder, chronic: Secondary | ICD-10-CM

## 2022-02-25 DIAGNOSIS — F331 Major depressive disorder, recurrent, moderate: Secondary | ICD-10-CM

## 2022-03-05 NOTE — Progress Notes (Deleted)
NEUROLOGY CONSULTATION NOTE  Tara Harper MRN: [id number] DOB: 1966/02/16  Referring provider: Mort Sawyers, FNP Primary care provider: Mort Sawyers, FNP  Reason for consult:  multiple sclerosis; migraine  Assessment/Plan:   ***   Subjective:  Tara Harper is a 56 year old ***-handed female with COPD, HTN, HLD, kidney disease and PTSD who presents to establish care for multiple sclerosis and migraines.  History supplemented by referring provider's notes.  Vision:  *** Motor:  *** Sensory:  *** Pain:  *** Gait:  *** Bowel/Bladder:  *** Fatigue:  *** Cognition:  *** Mood:  Depression and anxiety.  Migraines:  ***.  Was stable on amitriptyline.  Had been without it for several months but migraines remained stable.  Labs from February:  CBC with WBC 7.1, HGB 14.3, HCT 44.1, PLT 243, ALC 3.1; CMP with Na 140, K 4.7, Cl 108, CO2 28, glucose 93, BUN 17, Cr 1.83, t bili 0.4, ALP 80, AST 13, ALT 8, GFR 30.63; B12 785, folate 5.8; TSH 2.32; Hepatitis panel negative; SH 2.32; HIV negative; vit D 40.21  Current DMT:  None Current NSAIDS/analgesics:  *** Current triptans:  none Current ergotamine:  none Current anti-emetic:  none Current muscle relaxants:  cyclobenzaprine 5mg  QHS PRN, tizanidine 4mg  QAM PRN Current Antihypertensive medications:  losartan Current Antidepressant medications:  escitalopram 20mg  daily Current Anticonvulsant medications:  none Current anti-CGRP:  none Current Vitamins/Herbal/Supplements:  omega-3 Current Antihistamines/Decongestants:  none Other therapy:  *** Hormone/birth control:  none  Past DMT:  *** Past NSAIDS/analgesics:  *** Past abortive triptans:  *** Past abortive ergotamine:  *** Past muscle relaxants:  *** Past anti-emetic:  *** Past antihypertensive medications:  *** Past antidepressant medications:  amitriptyline (for migraines, helpful) Past anticonvulsant medications:  *** Past anti-CGRP:  *** Past  vitamins/Herbal/Supplements:  *** Past antihistamines/decongestants:  *** Other past therapies:  ***  Caffeine:  *** Alcohol:  *** Smoker:  *** Diet:  *** Exercise:  *** Depression:  ***; Anxiety:  *** Other pain:  *** Sleep hygiene:  *** Family history of headache:  ***      PAST MEDICAL HISTORY: Past Medical History:  Diagnosis Date   COPD (chronic obstructive pulmonary disease) ([facility])    Kidney disease    Post traumatic stress disorder     PAST SURGICAL HISTORY: Past Surgical History:  Procedure Laterality Date   CESAREAN SECTION     x2   CHOLECYSTECTOMY     MULTIPLE TOOTH EXTRACTIONS     gum disease   TUBAL LIGATION     umbilical hernia repaired      MEDICATIONS: Current Outpatient Medications on File Prior to Visit  Medication Sig Dispense Refill   aspirin EC 81 MG tablet Take 81 mg by mouth daily. Swallow whole.     cyclobenzaprine (FLEXERIL) 5 MG tablet Take one po qhs prn muscle spasm 30 tablet 2   escitalopram (LEXAPRO) 20 MG tablet TAKE 1 TABLET BY MOUTH EVERY DAY 90 tablet 0   folic acid (FOLVITE) 1 MG tablet Take 1 tablet (1 mg total) by mouth daily. 90 tablet 1   losartan (COZAAR) 50 MG tablet Take 1 tablet (50 mg total) by mouth daily. 90 tablet 1   Multiple Vitamins-Minerals (VITAMIN D3 COMPLETE PO) Take by mouth.     Omega-3 Fatty Acids (FISH OIL) 1000 MG CAPS Take by mouth.     omeprazole (PRILOSEC) 40 MG capsule Take 1 capsule (40 mg total) by mouth daily. 30 capsule 3   simvastatin (ZOCOR) 40  MG tablet Take 1 tablet (40 mg total) by mouth at bedtime. 90 tablet 3   tiZANidine (ZANAFLEX) 4 MG tablet Take one po qam prn muscle spasticity with MS Do not take with flexeril at same time 30 tablet 2   No current facility-administered medications on file prior to visit.    ALLERGIES: No Known Allergies  FAMILY HISTORY: Family History  Problem Relation Age of Onset   Lung cancer Mother    Heart disease Father    Diabetes Father     Objective:   *** General: No acute distress.  Patient appears well-groomed.   Head:  Normocephalic/atraumatic Eyes:  fundi examined but not visualized Neck: supple, no paraspinal tenderness, full range of motion Back: No paraspinal tenderness Heart: regular rate and rhythm Lungs: Clear to auscultation bilaterally. Vascular: No carotid bruits. Neurological Exam: Mental status: alert and oriented to person, place, and time, recent and remote memory intact, fund of knowledge intact, attention and concentration intact, speech fluent and not dysarthric, language intact. Cranial nerves: CN I: not tested CN II: pupils equal, round and reactive to light, visual fields intact CN III, IV, VI:  full range of motion, no nystagmus, no ptosis CN V: facial sensation intact. CN VII: upper and lower face symmetric CN VIII: hearing intact CN IX, X: gag intact, uvula midline CN XI: sternocleidomastoid and trapezius muscles intact CN XII: tongue midline Bulk & Tone: normal, no fasciculations. Motor:  muscle strength 5/5 throughout Sensation:  Pinprick, temperature and vibratory sensation intact. Deep Tendon Reflexes:  2+ throughout,  toes downgoing.   Finger to nose testing:  Without dysmetria.   Heel to shin:  Without dysmetria.   Gait:  Normal station and stride.  Romberg negative.    Thank you for allowing me to take part in the care of this patient.  Shon Millet, DO  CC: ***

## 2022-03-06 ENCOUNTER — Encounter: Payer: Self-pay | Admitting: Neurology

## 2022-03-06 ENCOUNTER — Ambulatory Visit: Payer: Medicaid Other | Admitting: Neurology

## 2022-03-06 DIAGNOSIS — Z029 Encounter for administrative examinations, unspecified: Secondary | ICD-10-CM

## 2022-03-16 ENCOUNTER — Ambulatory Visit: Payer: Medicaid Other | Admitting: Gastroenterology

## 2022-03-17 ENCOUNTER — Ambulatory Visit (INDEPENDENT_AMBULATORY_CARE_PROVIDER_SITE_OTHER): Payer: Medicaid Other | Admitting: Family

## 2022-03-17 ENCOUNTER — Other Ambulatory Visit: Payer: Self-pay | Admitting: Family

## 2022-03-17 ENCOUNTER — Encounter: Payer: Self-pay | Admitting: Family

## 2022-03-17 VITALS — BP 122/90 | HR 91 | Temp 98.3°F | Resp 16 | Ht 64.5 in | Wt 145.0 lb

## 2022-03-17 DIAGNOSIS — M62838 Other muscle spasm: Secondary | ICD-10-CM

## 2022-03-17 DIAGNOSIS — N1832 Chronic kidney disease, stage 3b: Secondary | ICD-10-CM | POA: Diagnosis not present

## 2022-03-17 DIAGNOSIS — G35 Multiple sclerosis: Secondary | ICD-10-CM | POA: Diagnosis not present

## 2022-03-17 DIAGNOSIS — R252 Cramp and spasm: Secondary | ICD-10-CM

## 2022-03-17 DIAGNOSIS — D529 Folate deficiency anemia, unspecified: Secondary | ICD-10-CM

## 2022-03-17 MED ORDER — TIZANIDINE HCL 2 MG PO CAPS: 2.0000 mg | ORAL_CAPSULE | Freq: Two times a day (BID) | ORAL | 5 refills | Status: DC
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
## 2022-03-20 NOTE — Telephone Encounter (Signed)
Pt called and wanted the nurse to know that her refill that was put in on the 23rd for: tizanidine (ZANAFLEX) 2 MG capsule, was in capsule form, she stated "the pharmacy said to rewrite it in tablet form instead." She tried to pick it up but it was not ready, she also stated that 2 mg was not enough and she wants to go to 4 mg instead. Please return a call when possible.  Callback Number: [phone]

## 2022-03-24 NOTE — Assessment & Plan Note (Signed)
Advised pt to call back again and schedule appt with neurology as this is important appt as pt possibly needs to get back on MS agents she was on previously for symptom control.

## 2022-03-24 NOTE — Assessment & Plan Note (Addendum)
continue folic acid 1 mg once daily Pt declined to get lab work today

## 2022-03-24 NOTE — Assessment & Plan Note (Signed)
rx tizanidine 2 mg, can take twice daily for muscle spasicity.  Renal precautions with CrCl.

## 2022-03-24 NOTE — Assessment & Plan Note (Signed)
Pt to continue f/u with nephrology as scheduled.

## 2022-03-26 DIAGNOSIS — Z419 Encounter for procedure for purposes other than remedying health state, unspecified: Secondary | ICD-10-CM | POA: Diagnosis not present

## 2022-04-25 DIAGNOSIS — Z419 Encounter for procedure for purposes other than remedying health state, unspecified: Secondary | ICD-10-CM | POA: Diagnosis not present

## 2022-05-08 DIAGNOSIS — N1832 Chronic kidney disease, stage 3b: Secondary | ICD-10-CM | POA: Diagnosis not present

## 2022-05-26 DIAGNOSIS — Z419 Encounter for procedure for purposes other than remedying health state, unspecified: Secondary | ICD-10-CM | POA: Diagnosis not present

## 2022-05-27 ENCOUNTER — Other Ambulatory Visit: Payer: Self-pay | Admitting: Family

## 2022-05-27 DIAGNOSIS — I10 Essential (primary) hypertension: Secondary | ICD-10-CM

## 2022-05-27 DIAGNOSIS — F4312 Post-traumatic stress disorder, chronic: Secondary | ICD-10-CM

## 2022-05-27 DIAGNOSIS — F331 Major depressive disorder, recurrent, moderate: Secondary | ICD-10-CM

## 2022-06-15 ENCOUNTER — Ambulatory Visit: Payer: Medicaid Other | Admitting: Neurology

## 2022-06-19 DIAGNOSIS — I129 Hypertensive chronic kidney disease with stage 1 through stage 4 chronic kidney disease, or unspecified chronic kidney disease: Secondary | ICD-10-CM | POA: Diagnosis not present

## 2022-06-19 DIAGNOSIS — R809 Proteinuria, unspecified: Secondary | ICD-10-CM | POA: Diagnosis not present

## 2022-06-19 DIAGNOSIS — N184 Chronic kidney disease, stage 4 (severe): Secondary | ICD-10-CM | POA: Diagnosis not present

## 2022-06-19 DIAGNOSIS — Z905 Acquired absence of kidney: Secondary | ICD-10-CM | POA: Diagnosis not present

## 2022-06-26 DIAGNOSIS — Z419 Encounter for procedure for purposes other than remedying health state, unspecified: Secondary | ICD-10-CM | POA: Diagnosis not present

## 2022-07-08 ENCOUNTER — Other Ambulatory Visit: Payer: Self-pay | Admitting: Family

## 2022-07-08 DIAGNOSIS — K219 Gastro-esophageal reflux disease without esophagitis: Secondary | ICD-10-CM

## 2022-07-08 DIAGNOSIS — D529 Folate deficiency anemia, unspecified: Secondary | ICD-10-CM

## 2022-07-14 ENCOUNTER — Other Ambulatory Visit: Payer: Self-pay | Admitting: Family

## 2022-07-14 DIAGNOSIS — F4312 Post-traumatic stress disorder, chronic: Secondary | ICD-10-CM
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
## 2022-07-22 ENCOUNTER — Other Ambulatory Visit (INDEPENDENT_AMBULATORY_CARE_PROVIDER_SITE_OTHER): Payer: Medicaid Other

## 2022-07-22 ENCOUNTER — Ambulatory Visit: Payer: Medicaid Other | Admitting: Neurology

## 2022-07-22 ENCOUNTER — Telehealth: Payer: Self-pay | Admitting: Neurology

## 2022-07-22 ENCOUNTER — Encounter: Payer: Self-pay | Admitting: Neurology

## 2022-07-22 VITALS — BP 114/77 | HR 92 | Ht 66.0 in | Wt 141.8 lb

## 2022-07-22 DIAGNOSIS — G43119 Migraine with aura, intractable, without status migrainosus: Secondary | ICD-10-CM | POA: Diagnosis not present

## 2022-07-22 DIAGNOSIS — G35 Multiple sclerosis: Secondary | ICD-10-CM | POA: Diagnosis not present

## 2022-07-22 LAB — CBC WITH DIFFERENTIAL/PLATELET
Basophils Absolute: 0.1 10*3/uL (ref 0.0–0.1)
Basophils Relative: 0.7 % (ref 0.0–3.0)
Eosinophils Absolute: 0.1 10*3/uL (ref 0.0–0.7)
Eosinophils Relative: 1.5 % (ref 0.0–5.0)
HCT: 41.9 % (ref 36.0–46.0)
Hemoglobin: 13.5 g/dL (ref 12.0–15.0)
Lymphocytes Relative: 28.6 % (ref 12.0–46.0)
Lymphs Abs: 2.2 10*3/uL (ref 0.7–4.0)
MCHC: 32.2 g/dL (ref 30.0–36.0)
MCV: 95.6 fl (ref 78.0–100.0)
Monocytes Absolute: 0.5 10*3/uL (ref 0.1–1.0)
Monocytes Relative: 7.1 % (ref 3.0–12.0)
Neutro Abs: 4.7 10*3/uL (ref 1.4–7.7)
Neutrophils Relative %: 62.1 % (ref 43.0–77.0)
Platelets: 239 10*3/uL (ref 150.0–400.0)
RBC: 4.38 Mil/uL (ref 3.87–5.11)
RDW: 13.2 % (ref 11.5–15.5)
WBC: 7.6 10*3/uL (ref 4.0–10.5)

## 2022-07-22 LAB — HEPATIC FUNCTION PANEL
ALT: 9 U/L (ref 0–35)
AST: 13 U/L (ref 0–37)
Albumin: 4.1 g/dL (ref 3.5–5.2)
Alkaline Phosphatase: 65 U/L (ref 39–117)
Bilirubin, Direct: 0.1 mg/dL (ref 0.0–0.3)
Total Bilirubin: 0.3 mg/dL (ref 0.2–1.2)
Total Protein: 7.1 g/dL (ref 6.0–8.3)

## 2022-07-22 LAB — VITAMIN D 25 HYDROXY (VIT D DEFICIENCY, FRACTURES): VITD: >120 ng/mL

## 2022-07-22 NOTE — Telephone Encounter (Signed)
Called [place] back and she was in the restroom. Was told to call back in about 5 minutes.

## 2022-07-22 NOTE — Telephone Encounter (Signed)
Called [facility] and unable to leave message. No voicemail and no answer.

## 2022-07-22 NOTE — Telephone Encounter (Signed)
Tara Harper is calling from the lab with critical results.

## 2022-07-22 NOTE — Patient Instructions (Addendum)
Will check MRI of brain/cervical/thoracic spine without contrast Check vit D, CBC with diff and hepatic panel Follow up after MRIs completed.

## 2022-07-23 NOTE — Progress Notes (Signed)
Patient advised.

## 2022-07-23 NOTE — Progress Notes (Signed)
LMOVm for patient to call the office back.  

## 2022-07-26 DIAGNOSIS — Z419 Encounter for procedure for purposes other than remedying health state, unspecified: Secondary | ICD-10-CM | POA: Diagnosis not present

## 2022-08-12 ENCOUNTER — Other Ambulatory Visit: Payer: Medicaid Other

## 2022-08-17 ENCOUNTER — Other Ambulatory Visit: Payer: Self-pay | Admitting: Family

## 2022-08-17 DIAGNOSIS — F4312 Post-traumatic stress disorder, chronic: Secondary | ICD-10-CM

## 2022-08-21 ENCOUNTER — Telehealth: Payer: Self-pay | Admitting: Neurology

## 2022-08-21 NOTE — Telephone Encounter (Signed)
Casey from [facility] called in about the prior authorization for the MRIs. They need additional notes or schedule a Peer to Peer. The Peer to Peer number is [phone]. Tracking number is [id number].   She stated the brain MRI was approved. Auth number: [id number]- 08/11/22-10/10/22.

## 2022-08-26 DIAGNOSIS — Z419 Encounter for procedure for purposes other than remedying health state, unspecified: Secondary | ICD-10-CM | POA: Diagnosis not present

## 2022-08-31 ENCOUNTER — Telehealth: Payer: Self-pay | Admitting: Anesthesiology

## 2022-08-31 NOTE — Telephone Encounter (Signed)
Patient called to follow up about this. She left a message to call back please.

## 2022-08-31 NOTE — Telephone Encounter (Signed)
See other phone note

## 2022-08-31 NOTE — Telephone Encounter (Signed)
Pt was called an given the number to [facility] so that she can get her MRI's scheduled she will call back to get a follow up schedule with Dr Tomi Likens after getting the MRI's scheduled.,

## 2022-08-31 NOTE — Telephone Encounter (Signed)
Patient called to ask about her MRI appointment now that it had been approved by her insurance. She has not received a call to get it scheduled yet.

## 2022-09-08 ENCOUNTER — Ambulatory Visit (INDEPENDENT_AMBULATORY_CARE_PROVIDER_SITE_OTHER): Payer: Medicaid Other | Admitting: Family

## 2022-09-08 ENCOUNTER — Ambulatory Visit (INDEPENDENT_AMBULATORY_CARE_PROVIDER_SITE_OTHER)
Admission: RE | Admit: 2022-09-08 | Discharge: 2022-09-08 | Disposition: A | Discharge status: Home / Self Care | Payer: Medicaid Other | Source: Ambulatory Visit | Attending: Family | Admitting: Family

## 2022-09-08 ENCOUNTER — Encounter: Payer: Self-pay | Admitting: Family

## 2022-09-08 VITALS — BP 110/62 | HR 77 | Temp 98.0°F | Resp 16 | Ht 66.0 in | Wt 141.1 lb

## 2022-09-08 DIAGNOSIS — M62838 Other muscle spasm: Secondary | ICD-10-CM

## 2022-09-08 DIAGNOSIS — S199XXA Unspecified injury of neck, initial encounter: Secondary | ICD-10-CM | POA: Diagnosis not present

## 2022-09-08 DIAGNOSIS — S299XXA Unspecified injury of thorax, initial encounter: Secondary | ICD-10-CM

## 2022-09-08 DIAGNOSIS — R809 Proteinuria, unspecified: Secondary | ICD-10-CM

## 2022-09-08 DIAGNOSIS — M47812 Spondylosis without myelopathy or radiculopathy, cervical region: Secondary | ICD-10-CM | POA: Diagnosis not present

## 2022-09-08 DIAGNOSIS — S0990XA Unspecified injury of head, initial encounter: Secondary | ICD-10-CM
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
## 2022-09-08 NOTE — Patient Instructions (Addendum)
  Lidocaine patches 4%  Tylenol as needed.    Regards,   Mort Sawyers FNP-C

## 2022-09-08 NOTE — Assessment & Plan Note (Signed)
Pt without muscle relaxer's, states was on them before moving here. States she went to neurologist who stated pcp needed to refill her muscle realxers, however flexeril/tizanidine were for muscle spasticity she noted from her Ms. Will correlate with neurology to see if these are still necessary.

## 2022-09-08 NOTE — Assessment & Plan Note (Signed)
For pain, recommend otc lidocaine patch 4% and tylenol only.  Advised against NSAIDS for kidney function

## 2022-09-08 NOTE — Assessment & Plan Note (Signed)
Stat xray thoracic spine ordered pending results.
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
## 2022-09-08 NOTE — Assessment & Plan Note (Signed)
Continue losartan. 

## 2022-09-08 NOTE — Assessment & Plan Note (Signed)
Stat ct head w/o contrast, pending insurance.  Did advise if any change in mental status, reoccurrence or worsening blurry vision or brain fog go to er and or call 911
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
## 2022-09-08 NOTE — Assessment & Plan Note (Signed)
Ordered cervical spine xray stat pending results

## 2022-09-10 ENCOUNTER — Telehealth: Payer: Self-pay

## 2022-09-10 NOTE — Telephone Encounter (Signed)
Left message to return call to our office.  

## 2022-09-21 NOTE — Progress Notes (Unsigned)
Cervical spine and thoracic Denied, Brain Approved.  Tried Engineer, structural to do a PEER-PEER, Per Dr. Mathews Robinsons Patient will Tara Harper to do the MRI Brain first to get a Baseline, Notes will have to say that the provider plan to start DMT.  Taron at [employer]: ok, I'll call and advise the patient of cancellation for the other two scans  8 mins TM Just spoke w/ patient and she aware she has to have the MRI Brain done so the results can be sent to insurance to see if they will approve the other 2.

## 2022-09-23 ENCOUNTER — Other Ambulatory Visit: Payer: Medicaid Other

## 2022-09-23 ENCOUNTER — Ambulatory Visit
Admission: RE | Admit: 2022-09-23 | Discharge: 2022-09-23 | Disposition: A | Discharge status: Home / Self Care | Payer: Medicaid Other | Source: Ambulatory Visit | Attending: Neurology | Admitting: Neurology

## 2022-09-23 DIAGNOSIS — G35 Multiple sclerosis: Secondary | ICD-10-CM | POA: Diagnosis not present

## 2022-09-24 ENCOUNTER — Other Ambulatory Visit: Payer: Self-pay | Admitting: Family

## 2022-09-24 DIAGNOSIS — F4312 Post-traumatic stress disorder, chronic: Secondary | ICD-10-CM

## 2022-09-24 DIAGNOSIS — F419 Anxiety disorder, unspecified: Secondary | ICD-10-CM

## 2022-09-25 DIAGNOSIS — Z419 Encounter for procedure for purposes other than remedying health state, unspecified: Secondary | ICD-10-CM | POA: Diagnosis not present

## 2022-09-25 NOTE — Telephone Encounter (Signed)
Called pt and she is taking the 20 mg instead of 10 mg. She need a refill on the mg. Pt wanted to know the status of the muscle relaxer.

## 2022-09-29 ENCOUNTER — Telehealth: Payer: Self-pay

## 2022-09-29 DIAGNOSIS — G35 Multiple sclerosis: Secondary | ICD-10-CM

## 2022-09-29 MED ORDER — ESCITALOPRAM OXALATE 20 MG PO TABS: 20.0000 mg | ORAL_TABLET | Freq: Every day | ORAL | 2 refills | Status: DC

## 2022-09-29 NOTE — Addendum Note (Signed)
Addended by: Mort Sawyers on: 09/29/2022 09:03 PM   Modules accepted: Orders

## 2022-09-29 NOTE — Progress Notes (Signed)
Tried calling patient, no answer. LMOVM for patient to call back.

## 2022-09-29 NOTE — Telephone Encounter (Signed)
-----   Message from Drema Dallas, DO sent at 09/25/2022  6:59 AM EST ----- MRI of brain shows spots, which is expected.  In preparation to start a disease modifying drug for MS, I would like to check MRI of cervical and thoracic spine without contrast

## 2022-09-29 NOTE — Telephone Encounter (Signed)
Refill sent in for 20 mg lexapro.   Please advise pt that I reached out to her neurologist who stated he is pending the MRI to confirm that she has MS. And even then, he stated in his experience that spascity is not MS related so he does not feel this would be a necessary treatment for MS.   If pt is having chronic muscle spasms I may need to refer her to somebody else.

## 2022-10-26 DIAGNOSIS — Z419 Encounter for procedure for purposes other than remedying health state, unspecified: Secondary | ICD-10-CM | POA: Diagnosis not present

## 2022-10-30 ENCOUNTER — Other Ambulatory Visit: Payer: Self-pay | Admitting: Family

## 2022-10-30 DIAGNOSIS — F331 Major depressive disorder, recurrent, moderate: Secondary | ICD-10-CM

## 2022-10-30 DIAGNOSIS — I10 Essential (primary) hypertension: Secondary | ICD-10-CM

## 2022-10-30 DIAGNOSIS — F4312 Post-traumatic stress disorder, chronic: Secondary | ICD-10-CM

## 2022-11-01 ENCOUNTER — Other Ambulatory Visit: Payer: Medicaid Other

## 2022-11-03 MED ORDER — ESCITALOPRAM OXALATE 20 MG PO TABS: 20.0000 mg | ORAL_TABLET | Freq: Every day | ORAL | 1 refills | Status: AC

## 2022-11-04 IMAGING — US US ABDOMEN COMPLETE
1 series · 14 of 25 positions shown · non-contrast
Comparison: None.

CLINICAL DATA: Enlarged liver. Jaundice. Status post
cholecystectomy.

EXAM:
ABDOMEN ULTRASOUND COMPLETE

[Series 1: us abdomen complete · 0.20mm/px · 93 acquisitions, 14 frames shown]
[im 1/93]
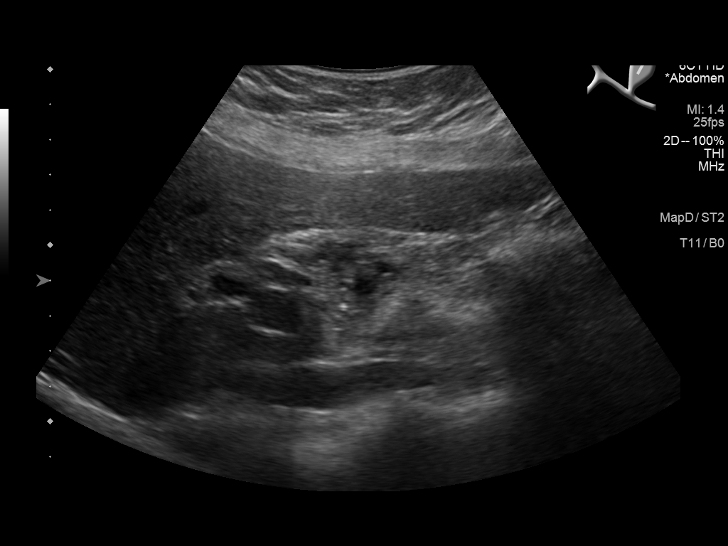
[im 8/93]
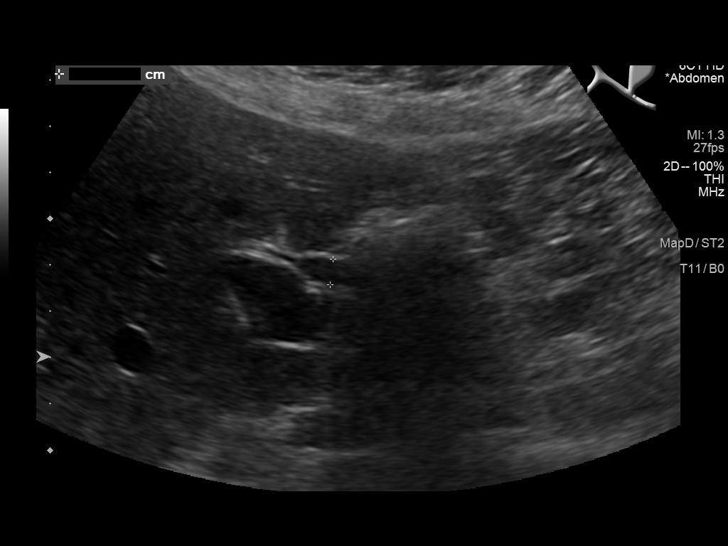
[im 16/93]
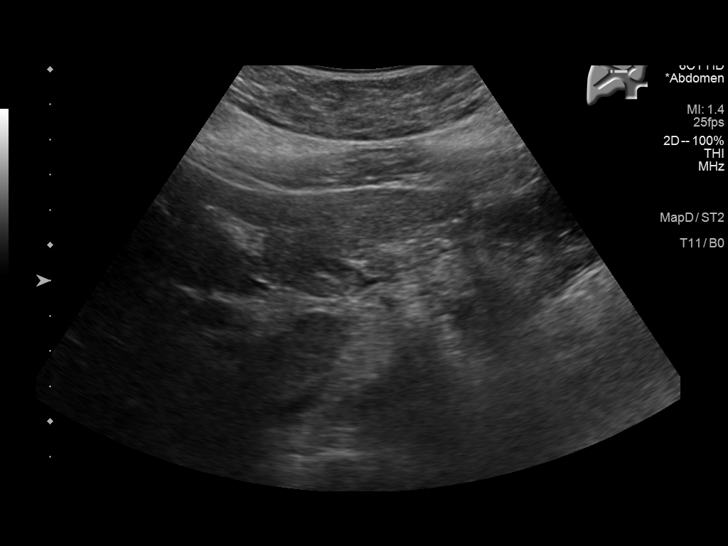
[im 24/93]
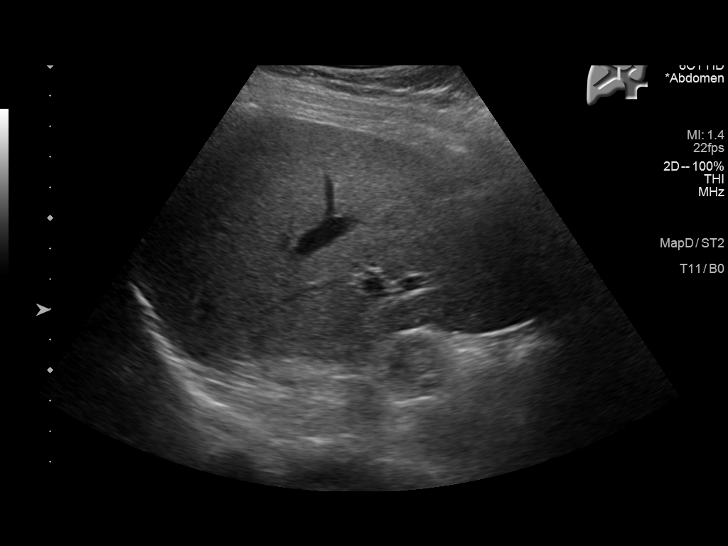
[im 31/93]
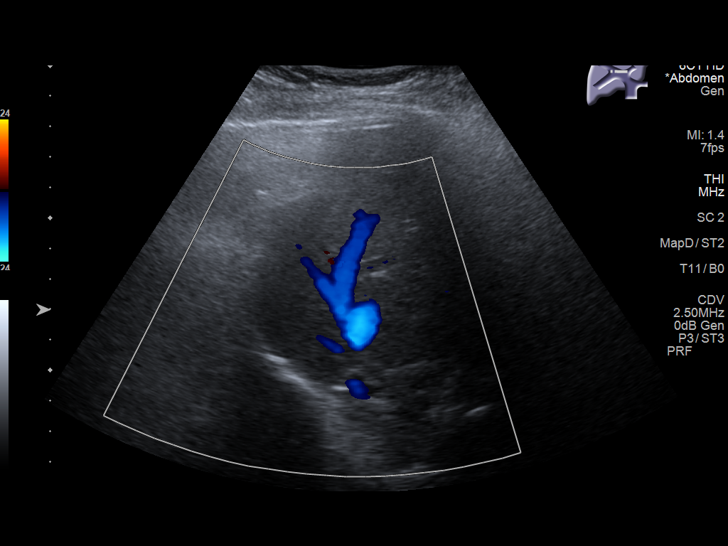
[im 35/93]
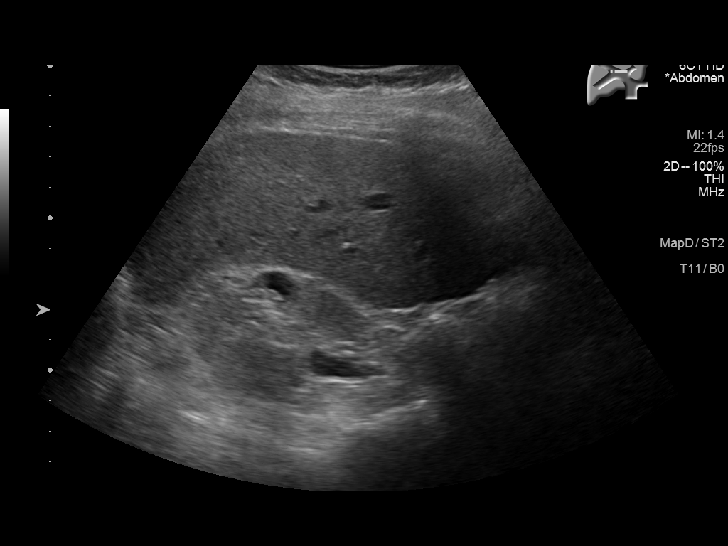
[im 43/93]
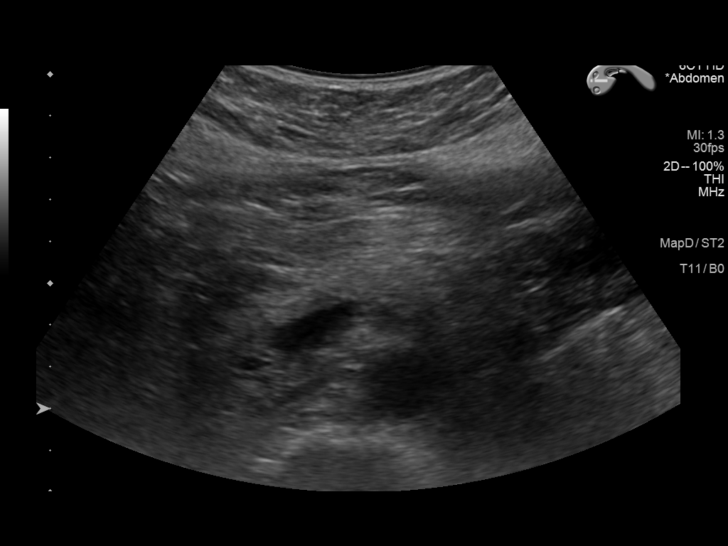
[im 50/93]
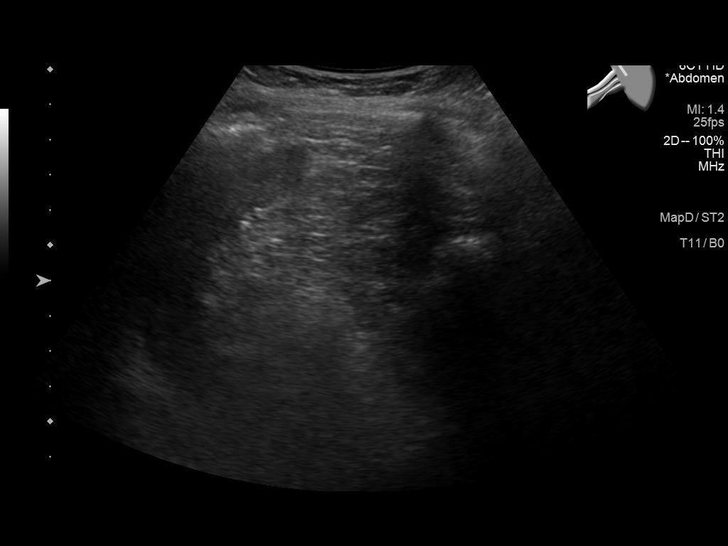
[im 58/93]
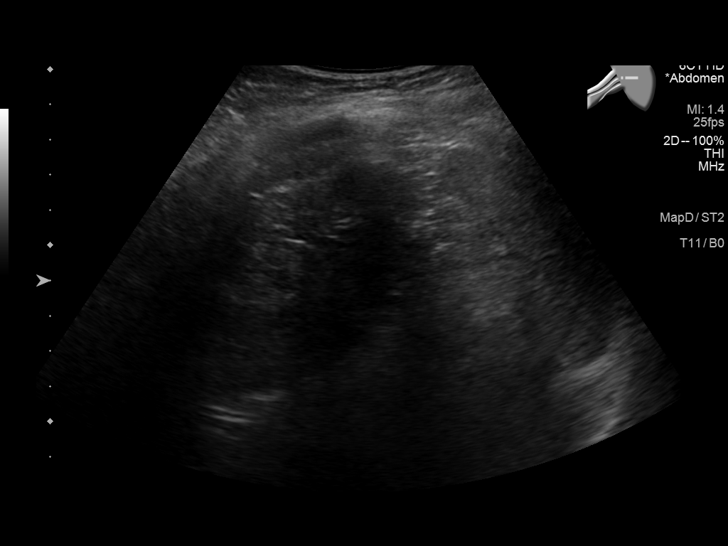
[im 62/93]
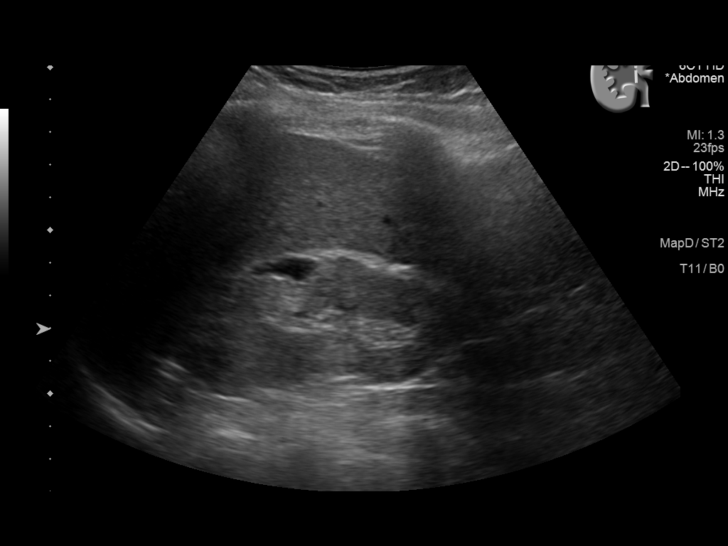
[im 70/93]
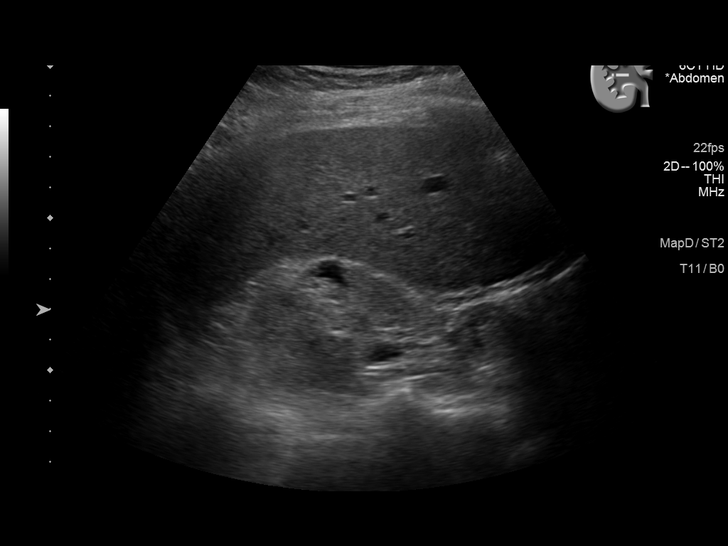
[im 77/93]
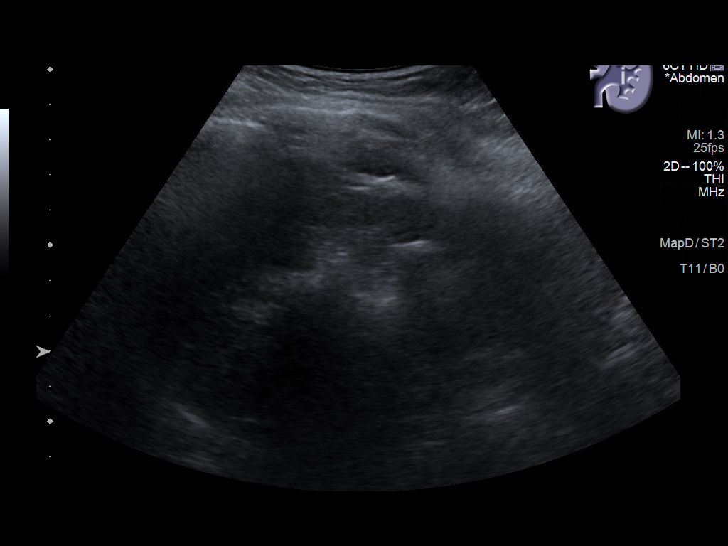
[im 85/93]
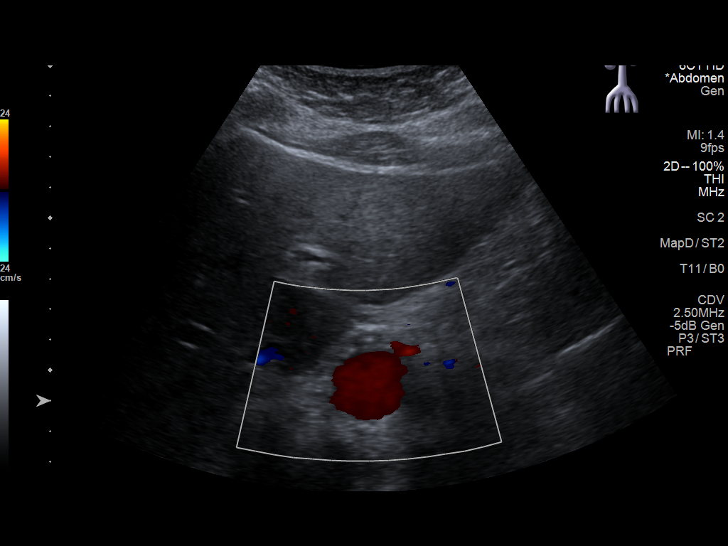
[im 93/93]
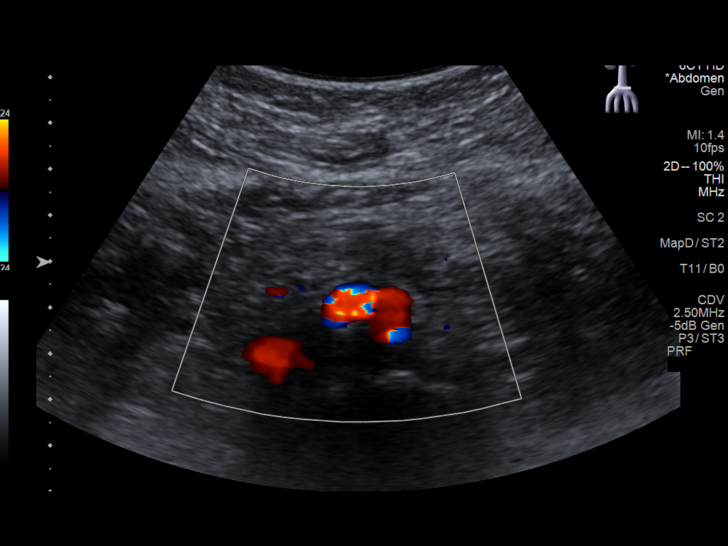

[14 of 25 positions shown; findings below may reference images not displayed]

FINDINGS: Gallbladder: Status post cholecystectomy.

Common bile duct: Diameter: 5.6 mm. No signs of intrahepatic bile
duct dilatation.

Liver: No focal lesion identified. Within normal limits in
parenchymal echogenicity. Portal vein is patent on color Doppler
imaging with normal direction of blood flow towards the liver.

IVC: No abnormality visualized.

Pancreas: Visualized portion unremarkable.

Spleen: Size and appearance within normal limits.

Right Kidney: Length: 9.4 cm. Increased parenchymal echogenicity.
Right kidney cysts. The largest arises off the upper pole measuring
1.4 x 1.1 x 1.6 cm.

Left Kidney: Length: Not confidently identified.

Abdominal aorta: No aneurysm visualized.

Other findings: None.
IMPRESSION: 1. Status post cholecystectomy.  No signs of bile duct dilatation.
2. Increased echogenicity of the right kidney suggesting medical
renal disease.
3. Right kidney cysts.
4. Nonvisualization of the left kidney.

## 2022-11-05 ENCOUNTER — Telehealth: Payer: Self-pay

## 2022-11-05 NOTE — Telephone Encounter (Signed)
Patient came into the office and wanted to know if Dr.Jaffe wanted to see her, said she is currently waiting for the 2nd MRI to be authorized and was curious if she should wait until after the second or schedule an appointment to go over the first one.

## 2022-11-06 ENCOUNTER — Telehealth: Payer: Self-pay | Admitting: Neurology

## 2022-11-06 NOTE — Telephone Encounter (Signed)
See previous encounter

## 2022-11-06 NOTE — Telephone Encounter (Signed)
Called and informed patient her Dr. Georgie Chard advice and recommendations to Follow up should wait until after all MRIs are completed.  Dr. Tomi Likens does have anything else to offer until then.  Patient wanted me to send a message to Dr. Tomi Likens and see if he can give her a muscle relaxer Tizanidine to help. She is in pain and miserable in the colder months. Patient is aware I will send a message to Dr. Tomi Likens to see if there is anything he can do.

## 2022-11-06 NOTE — Telephone Encounter (Signed)
Called patient and informed her that Dr. Tomi Likens stated that he will  need to look at her records or the MRIs before he can prescribe her medications in order to make sure the treatment is appropriate.  She should get refills from her PCP until then. Patient was not satisfied with this. I informed patient that she is welcome to go to the ER or Urgent Care if she is in that much pain or call her PCP like Dr. Tomi Likens suggested. Patient verbalized understanding and had no further questions or concerns.

## 2022-11-06 NOTE — Telephone Encounter (Signed)
Pt called in stating she just answered a call from our office and hit the wrong button and it hung up.

## 2022-11-25 ENCOUNTER — Ambulatory Visit
Admission: RE | Admit: 2022-11-25 | Discharge: 2022-11-25 | Disposition: A | Discharge status: Home / Self Care | Payer: Medicaid Other | Source: Ambulatory Visit | Attending: Neurology | Admitting: Neurology

## 2022-11-25 ENCOUNTER — Other Ambulatory Visit: Payer: Self-pay | Admitting: Neurology

## 2022-11-25 DIAGNOSIS — G35 Multiple sclerosis: Secondary | ICD-10-CM

## 2022-11-26 DIAGNOSIS — Z419 Encounter for procedure for purposes other than remedying health state, unspecified: Secondary | ICD-10-CM | POA: Diagnosis not present

## 2022-11-27 ENCOUNTER — Telehealth: Payer: Self-pay | Admitting: Anesthesiology

## 2022-11-27 NOTE — Telephone Encounter (Signed)
Pt called requesting MRI results.  

## 2022-11-27 NOTE — Telephone Encounter (Signed)
MRIs of the spinal cord do not show evidence of MS.  Findings on the brain MRI are nonspecific.  At this time, I do not suspect that she has MS.   Wants to know what is wrong with her and she had MRI's that has shown she has MS.

## 2022-11-27 NOTE — Telephone Encounter (Signed)
Patient is upset with her results because she has all these symptoms and no answers. Patient wants an appointment with Dr. Tomi Likens to discuss what else might be going on. I informed patient that I can have someone from the front give her a call to get her scheduled to see Dr. Tomi Likens.

## 2022-11-27 NOTE — Telephone Encounter (Signed)
Patient is calling in asking for the results of MRI. States she missed a call yesterday about the results but hasn't heard back from anyone.
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
## 2022-12-08 ENCOUNTER — Encounter: Payer: Self-pay | Admitting: Neurology

## 2022-12-08 ENCOUNTER — Ambulatory Visit: Payer: Medicaid Other | Admitting: Neurology
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
## 2022-12-21 ENCOUNTER — Ambulatory Visit: Payer: Medicaid Other | Admitting: Neurology

## 2022-12-21 ENCOUNTER — Other Ambulatory Visit (INDEPENDENT_AMBULATORY_CARE_PROVIDER_SITE_OTHER): Payer: Medicaid Other

## 2022-12-21 ENCOUNTER — Encounter: Payer: Self-pay | Admitting: Neurology

## 2022-12-21 VITALS — BP 112/76 | HR 78 | Ht 64.0 in | Wt 139.5 lb

## 2022-12-21 DIAGNOSIS — R202 Paresthesia of skin: Secondary | ICD-10-CM

## 2022-12-21 DIAGNOSIS — R9089 Other abnormal findings on diagnostic imaging of central nervous system: Secondary | ICD-10-CM | POA: Diagnosis not present

## 2022-12-21 DIAGNOSIS — R531 Weakness: Secondary | ICD-10-CM

## 2022-12-21 DIAGNOSIS — M62838 Other muscle spasm: Secondary | ICD-10-CM | POA: Diagnosis not present

## 2022-12-21 DIAGNOSIS — G43109 Migraine with aura, not intractable, without status migrainosus: Secondary | ICD-10-CM | POA: Diagnosis not present

## 2022-12-21 LAB — TSH: TSH: 1.95 u[IU]/mL (ref 0.35–5.50)

## 2022-12-21 LAB — SEDIMENTATION RATE: Sed Rate: 33 mm/hr — ABNORMAL HIGH (ref 0–30)

## 2022-12-21 LAB — CK: Total CK: 70 U/L (ref 7–177)

## 2022-12-21 LAB — VITAMIN B12: Vitamin B-12: 459 pg/mL (ref 211–911)

## 2022-12-21 NOTE — Patient Instructions (Signed)
First will check blood work:  sed rate, B12, TSH, CK, aldolase Then we will check a nerve conduction study (right arm and leg) Further recommendations pending results.  If tests unrevealing, will consider spinal tap Follow up after testing.

## 2022-12-24 LAB — ALDOLASE: Aldolase: 3.5 U/L (ref <=8.1)

## 2022-12-25 ENCOUNTER — Ambulatory Visit: Payer: Medicaid Other | Admitting: Neurology

## 2022-12-25 DIAGNOSIS — R202 Paresthesia of skin: Secondary | ICD-10-CM

## 2022-12-25 DIAGNOSIS — M62838 Other muscle spasm: Secondary | ICD-10-CM | POA: Diagnosis not present

## 2022-12-25 DIAGNOSIS — R531 Weakness: Secondary | ICD-10-CM

## 2022-12-25 DIAGNOSIS — Z419 Encounter for procedure for purposes other than remedying health state, unspecified: Secondary | ICD-10-CM | POA: Diagnosis not present

## 2022-12-25 NOTE — Procedures (Signed)
[facility]  [address]  [place], [place] [address] Tel: [phone] Fax: [phone] Test Date:  12/25/2022  Patient: Tara Harper DOB: 23-May-1966 Physician: Narda Amber, DO  Sex: Female Height: '5\' 4"'$  Ref Phys: Metta Clines, DO  ID#: [id number]   Technician:    History: This is a 57 year old female referred for evaluation of generalized pain and paresthesias.  NCV & EMG Findings: Extensive electrodiagnostic testing of the right upper and lower extremity shows:  Right median, ulnar, mixed palmar, sural, and superficial peroneal sensory responses are within normal limits. Right median, ulnar, peroneal, and tibial motor responses are within normal limits. Right tibial H reflex study is within normal limits. There is no evidence of active or chronic motor axonal loss changes affecting any of the tested muscles.  Motor unit configuration and recruitment pattern is within normal limits.  Impression: This is a normal study of the right upper and lower extremities.  In particular, there is no evidence of obvious large fiber sensorimotor polyneuropathy, cervical/lumbosacral radiculopathy, or diffuse myopathy.   ___________________________ Narda Amber, DO    Nerve Conduction Studies   Stim Site NR Peak (ms) Norm Peak (ms) O-P Amp (V) Norm O-P Amp  Right Median Anti Sensory (2nd Digit)  32 C  Wrist    2.9 <3.6 33.4 >15  Right Sup Peroneal Anti Sensory (Ant Lat Mall)  32 C  12 cm    2.7 <4.6 11.2 >4  Right Sural Anti Sensory (Lat Mall)  32 C  Calf    2.5 <4.6 17.3 >4  Right Ulnar Anti Sensory (5th Digit)  32 C  Wrist    2.4 <3.1 26.8 >10     Stim Site NR Onset (ms) Norm Onset (ms) O-P Amp (mV) Norm O-P Amp Site1 Site2 Delta-0 (ms) Dist (cm) Vel (m/s) Norm Vel (m/s)  Right Median Motor (Abd Poll Brev)  32 C  Wrist    3.0 <4.0 8.4 >6 Elbow Wrist 4.3 27.0 63 >50  Elbow    7.3  7.2         Right Peroneal Motor (Ext Dig Brev)  32 C  Ankle    2.8  <6.0 3.9 >2.5 B Fib Ankle 7.0 32.0 46 >40  B Fib    9.8  3.5  Poplt B Fib 1.7 8.0 47 >40  Poplt    11.5  3.4         Right Tibial Motor (Abd Hall Brev)  32 C  Ankle    3.0 <6.0 10.1 >4 Knee Ankle 8.0 38.0 48 >40  Knee    11.0  7.5         Right Ulnar Motor (Abd Dig Minimi)  32 C  Wrist    2.6 <3.1 9.8 >7 B Elbow Wrist 3.1 20.0 65 >50  B Elbow    5.7  9.7  A Elbow B Elbow 1.4 10.0 71 >50  A Elbow    7.1  9.6            Stim Site NR Peak (ms) Norm Peak (ms) P-T Amp (V) Site1 Site2 Delta-P (ms) Norm Delta (ms)  Right Median/Ulnar Palm Comparison (Wrist - 8cm)  32 C  Median Palm    1.7 <2.2 78.2 Median Palm Ulnar Palm 0.3   Ulnar Palm    1.4 <2.2 39.6       Electromyography   Side Muscle Ins.Act Fibs Fasc Recrt Amp Dur Poly Activation Comment  Right 1stDorInt Nml Nml Nml Nml  Nml Nml Nml Nml N/A  Right PronatorTeres Nml Nml Nml Nml Nml Nml Nml Nml N/A  Right Biceps Nml Nml Nml Nml Nml Nml Nml Nml N/A  Right Triceps Nml Nml Nml Nml Nml Nml Nml Nml N/A  Right Deltoid Nml Nml Nml Nml Nml Nml Nml Nml N/A  Right AntTibialis Nml Nml Nml Nml Nml Nml Nml Nml N/A  Right Gastroc Nml Nml Nml Nml Nml Nml Nml Nml N/A  Right Flex Dig Long Nml Nml Nml Nml Nml Nml Nml Nml N/A  Right RectFemoris Nml Nml Nml Nml Nml Nml Nml Nml N/A  Right BicepsFemS Nml Nml Nml Nml Nml Nml Nml Nml N/A      Waveforms:

## 2022-12-29 ENCOUNTER — Other Ambulatory Visit: Payer: Self-pay

## 2022-12-29 DIAGNOSIS — G43109 Migraine with aura, not intractable, without status migrainosus: Secondary | ICD-10-CM

## 2022-12-29 DIAGNOSIS — R202 Paresthesia of skin: Secondary | ICD-10-CM

## 2022-12-29 DIAGNOSIS — R9089 Other abnormal findings on diagnostic imaging of central nervous system: Secondary | ICD-10-CM

## 2022-12-29 DIAGNOSIS — M62838 Other muscle spasm: Secondary | ICD-10-CM

## 2022-12-29 DIAGNOSIS — G35 Multiple sclerosis: Secondary | ICD-10-CM

## 2022-12-29 DIAGNOSIS — G43119 Migraine with aura, intractable, without status migrainosus: Secondary | ICD-10-CM

## 2022-12-29 DIAGNOSIS — R531 Weakness: Secondary | ICD-10-CM

## 2022-12-30 ENCOUNTER — Other Ambulatory Visit: Payer: Self-pay | Admitting: Neurology

## 2022-12-30 DIAGNOSIS — G35 Multiple sclerosis: Secondary | ICD-10-CM

## 2023-01-03 ENCOUNTER — Other Ambulatory Visit: Payer: Self-pay | Admitting: Family

## 2023-01-03 DIAGNOSIS — D529 Folate deficiency anemia, unspecified: Secondary | ICD-10-CM

## 2023-01-03 DIAGNOSIS — K219 Gastro-esophageal reflux disease without esophagitis: Secondary | ICD-10-CM

## 2023-01-03 DIAGNOSIS — E782 Mixed hyperlipidemia: Secondary | ICD-10-CM

## 2023-01-04 ENCOUNTER — Telehealth: Payer: Self-pay | Admitting: Neurology

## 2023-01-04 NOTE — Telephone Encounter (Signed)
Pt called in and left a message with the access nurse. She is returning our call

## 2023-01-05 NOTE — Telephone Encounter (Signed)
Called and informed pt of Dr. Tomi Likens answer about contrast with MRI. She will call back when she decides what she will do. She wants to call kidney doctor first.

## 2023-01-25 DIAGNOSIS — Z419 Encounter for procedure for purposes other than remedying health state, unspecified: Secondary | ICD-10-CM | POA: Diagnosis not present

## 2023-02-24 DIAGNOSIS — Z419 Encounter for procedure for purposes other than remedying health state, unspecified: Secondary | ICD-10-CM | POA: Diagnosis not present

## 2023-03-27 DIAGNOSIS — Z419 Encounter for procedure for purposes other than remedying health state, unspecified: Secondary | ICD-10-CM | POA: Diagnosis not present

## 2023-04-05 ENCOUNTER — Ambulatory Visit: Payer: Medicaid Other | Admitting: Neurology

## 2023-04-26 DIAGNOSIS — Z419 Encounter for procedure for purposes other than remedying health state, unspecified: Secondary | ICD-10-CM | POA: Diagnosis not present

## 2023-05-27 DIAGNOSIS — Z419 Encounter for procedure for purposes other than remedying health state, unspecified: Secondary | ICD-10-CM | POA: Diagnosis not present

## 2023-06-27 DIAGNOSIS — Z419 Encounter for procedure for purposes other than remedying health state, unspecified: Secondary | ICD-10-CM | POA: Diagnosis not present

## 2024-04-06 DIAGNOSIS — Z419 Encounter for procedure for purposes other than remedying health state, unspecified: Secondary | ICD-10-CM | POA: Diagnosis not present

## 2024-05-06 DIAGNOSIS — Z419 Encounter for procedure for purposes other than remedying health state, unspecified: Secondary | ICD-10-CM | POA: Diagnosis not present

## 2024-06-06 DIAGNOSIS — Z419 Encounter for procedure for purposes other than remedying health state, unspecified: Secondary | ICD-10-CM | POA: Diagnosis not present

## 2024-07-07 DIAGNOSIS — Z419 Encounter for procedure for purposes other than remedying health state, unspecified: Secondary | ICD-10-CM | POA: Diagnosis not present
